# Patient Record
Sex: Female | Born: 1980 | Race: Black or African American | Hispanic: No | Marital: Married | Smoking: Current every day smoker
Health system: Southern US, Community
[De-identification: ages and names within clinical notes are randomized; demographics above are authoritative.]

## PROBLEM LIST (undated history)

## (undated) ENCOUNTER — Inpatient Hospital Stay (HOSPITAL_COMMUNITY): Payer: Self-pay

## (undated) DIAGNOSIS — B009 Herpesviral infection, unspecified: Secondary | ICD-10-CM

## (undated) DIAGNOSIS — R87629 Unspecified abnormal cytological findings in specimens from vagina: Secondary | ICD-10-CM

## (undated) DIAGNOSIS — O039 Complete or unspecified spontaneous abortion without complication: Secondary | ICD-10-CM

## (undated) DIAGNOSIS — C8190 Hodgkin lymphoma, unspecified, unspecified site: Secondary | ICD-10-CM

## (undated) DIAGNOSIS — I10 Essential (primary) hypertension: Secondary | ICD-10-CM

## (undated) DIAGNOSIS — C8590 Non-Hodgkin lymphoma, unspecified, unspecified site: Secondary | ICD-10-CM

## (undated) HISTORY — PX: TONSILLECTOMY: SUR1361

## (undated) HISTORY — DX: Non-Hodgkin lymphoma, unspecified, unspecified site: C85.90

## (undated) HISTORY — PX: OTHER SURGICAL HISTORY: SHX169

## (undated) HISTORY — PX: TYMPANOSTOMY TUBE PLACEMENT: SHX32

## (undated) HISTORY — DX: Unspecified abnormal cytological findings in specimens from vagina: R87.629

## (undated) HISTORY — DX: Complete or unspecified spontaneous abortion without complication: O03.9

## (undated) HISTORY — PX: BIOPSY SALIVARY GLAND: PRO30

---
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
## 2001-03-20 DIAGNOSIS — C8590 Non-Hodgkin lymphoma, unspecified, unspecified site: Secondary | ICD-10-CM

## 2001-03-20 HISTORY — DX: Non-Hodgkin lymphoma, unspecified, unspecified site: C85.90

## 2001-03-27 ENCOUNTER — Encounter: Payer: Self-pay | Admitting: Unknown Physician Specialty

## 2001-03-27 ENCOUNTER — Ambulatory Visit (HOSPITAL_COMMUNITY): Admission: RE | Admit: 2001-03-27 | Discharge: 2001-03-27 | Payer: Self-pay | Admitting: Otolaryngology

## 2001-03-29 ENCOUNTER — Ambulatory Visit (HOSPITAL_COMMUNITY): Admission: RE | Admit: 2001-03-29 | Discharge: 2001-03-29 | Payer: Self-pay | Admitting: Unknown Physician Specialty

## 2001-03-29 ENCOUNTER — Encounter: Payer: Self-pay | Admitting: Unknown Physician Specialty

## 2001-04-01 ENCOUNTER — Other Ambulatory Visit: Admission: RE | Admit: 2001-04-01 | Discharge: 2001-04-01 | Payer: Self-pay | Admitting: Otolaryngology

## 2001-04-02 ENCOUNTER — Other Ambulatory Visit: Admission: RE | Admit: 2001-04-02 | Discharge: 2001-04-02 | Payer: Self-pay | Admitting: Otolaryngology

## 2001-04-11 ENCOUNTER — Ambulatory Visit (HOSPITAL_COMMUNITY): Admission: RE | Admit: 2001-04-11 | Discharge: 2001-04-11 | Payer: Self-pay | Admitting: Otolaryngology

## 2001-04-11 ENCOUNTER — Encounter: Payer: Self-pay | Admitting: Otolaryngology

## 2001-04-26 ENCOUNTER — Encounter (HOSPITAL_COMMUNITY): Admission: RE | Admit: 2001-04-26 | Discharge: 2001-05-26 | Payer: Self-pay | Admitting: Oncology

## 2001-05-09 ENCOUNTER — Encounter (HOSPITAL_COMMUNITY): Payer: Self-pay | Admitting: Oncology

## 2001-05-10 ENCOUNTER — Encounter (HOSPITAL_COMMUNITY): Payer: Self-pay | Admitting: Oncology

## 2001-05-10 ENCOUNTER — Ambulatory Visit (HOSPITAL_COMMUNITY): Admission: RE | Admit: 2001-05-10 | Discharge: 2001-05-10 | Payer: Self-pay | Admitting: Oncology

## 2001-05-10 ENCOUNTER — Encounter: Payer: Self-pay | Admitting: General Surgery

## 2001-06-04 ENCOUNTER — Encounter (HOSPITAL_COMMUNITY): Admission: RE | Admit: 2001-06-04 | Discharge: 2001-07-04 | Payer: Self-pay | Admitting: Oncology

## 2001-06-04 ENCOUNTER — Encounter: Admission: RE | Admit: 2001-06-04 | Discharge: 2001-06-04 | Payer: Self-pay | Admitting: Oncology

## 2001-06-11 ENCOUNTER — Encounter (HOSPITAL_COMMUNITY): Payer: Self-pay | Admitting: Oncology

## 2001-06-18 ENCOUNTER — Emergency Department (HOSPITAL_COMMUNITY): Admission: EM | Admit: 2001-06-18 | Discharge: 2001-06-18 | Payer: Self-pay | Admitting: *Deleted

## 2001-06-18 ENCOUNTER — Encounter: Payer: Self-pay | Admitting: *Deleted

## 2001-06-18 ENCOUNTER — Encounter (HOSPITAL_COMMUNITY): Payer: Self-pay | Admitting: Oncology

## 2001-06-28 ENCOUNTER — Encounter (HOSPITAL_COMMUNITY): Payer: Self-pay | Admitting: Oncology

## 2001-07-02 ENCOUNTER — Encounter (HOSPITAL_COMMUNITY): Admission: RE | Admit: 2001-07-02 | Discharge: 2001-08-01 | Payer: Self-pay | Admitting: Oncology

## 2001-07-02 ENCOUNTER — Encounter: Admission: RE | Admit: 2001-07-02 | Discharge: 2001-07-02 | Payer: Self-pay | Admitting: Oncology

## 2001-07-09 ENCOUNTER — Encounter: Payer: Self-pay | Admitting: *Deleted

## 2001-07-09 ENCOUNTER — Emergency Department (HOSPITAL_COMMUNITY): Admission: EM | Admit: 2001-07-09 | Discharge: 2001-07-09 | Payer: Self-pay | Admitting: *Deleted

## 2001-08-06 ENCOUNTER — Encounter (HOSPITAL_COMMUNITY): Admission: RE | Admit: 2001-08-06 | Discharge: 2001-09-05 | Payer: Self-pay | Admitting: Oncology

## 2001-08-06 ENCOUNTER — Encounter: Admission: RE | Admit: 2001-08-06 | Discharge: 2001-08-06 | Payer: Self-pay | Admitting: Oncology

## 2001-08-26 ENCOUNTER — Ambulatory Visit (HOSPITAL_COMMUNITY): Admission: RE | Admit: 2001-08-26 | Discharge: 2001-08-26 | Payer: Self-pay | Admitting: Oncology

## 2001-08-26 ENCOUNTER — Encounter (HOSPITAL_COMMUNITY): Payer: Self-pay | Admitting: Oncology

## 2001-08-27 ENCOUNTER — Ambulatory Visit: Admission: RE | Admit: 2001-08-27 | Discharge: 2001-11-25 | Payer: Self-pay | Admitting: Radiation Oncology

## 2001-09-09 ENCOUNTER — Encounter (HOSPITAL_COMMUNITY): Admission: RE | Admit: 2001-09-09 | Discharge: 2001-10-09 | Payer: Self-pay | Admitting: Oncology

## 2001-09-09 ENCOUNTER — Encounter: Admission: RE | Admit: 2001-09-09 | Discharge: 2001-09-09 | Payer: Self-pay | Admitting: Oncology

## 2001-10-16 ENCOUNTER — Encounter: Admission: RE | Admit: 2001-10-16 | Discharge: 2001-10-16 | Payer: Self-pay | Admitting: Oncology

## 2001-10-16 ENCOUNTER — Encounter (HOSPITAL_COMMUNITY): Admission: RE | Admit: 2001-10-16 | Discharge: 2001-11-15 | Payer: Self-pay | Admitting: Oncology

## 2001-11-01 ENCOUNTER — Ambulatory Visit (HOSPITAL_COMMUNITY): Admission: RE | Admit: 2001-11-01 | Discharge: 2001-11-01 | Payer: Self-pay | Admitting: General Surgery

## 2001-11-21 ENCOUNTER — Encounter (HOSPITAL_COMMUNITY): Payer: Self-pay | Admitting: Oncology

## 2001-11-21 ENCOUNTER — Encounter (HOSPITAL_COMMUNITY): Admission: RE | Admit: 2001-11-21 | Discharge: 2001-12-21 | Payer: Self-pay | Admitting: Oncology

## 2002-02-17 ENCOUNTER — Emergency Department (HOSPITAL_COMMUNITY): Admission: EM | Admit: 2002-02-17 | Discharge: 2002-02-17 | Payer: Self-pay | Admitting: Emergency Medicine

## 2002-03-04 ENCOUNTER — Ambulatory Visit (HOSPITAL_COMMUNITY): Admission: RE | Admit: 2002-03-04 | Discharge: 2002-03-04 | Payer: Self-pay | Admitting: Internal Medicine

## 2002-03-04 ENCOUNTER — Encounter (HOSPITAL_COMMUNITY): Admission: RE | Admit: 2002-03-04 | Discharge: 2002-04-03 | Payer: Self-pay | Admitting: Oncology

## 2002-03-04 ENCOUNTER — Encounter: Payer: Self-pay | Admitting: Internal Medicine

## 2002-03-04 ENCOUNTER — Encounter: Admission: RE | Admit: 2002-03-04 | Discharge: 2002-03-04 | Payer: Self-pay | Admitting: Oncology

## 2002-05-21 ENCOUNTER — Encounter: Admission: RE | Admit: 2002-05-21 | Discharge: 2002-05-21 | Payer: Self-pay | Admitting: Oncology

## 2002-05-23 ENCOUNTER — Encounter (HOSPITAL_COMMUNITY): Payer: Self-pay | Admitting: Oncology

## 2002-05-23 ENCOUNTER — Encounter (HOSPITAL_COMMUNITY): Admission: RE | Admit: 2002-05-23 | Discharge: 2002-06-22 | Payer: Self-pay | Admitting: Oncology

## 2002-05-29 ENCOUNTER — Encounter (HOSPITAL_COMMUNITY): Payer: Self-pay | Admitting: Oncology

## 2002-06-14 ENCOUNTER — Emergency Department (HOSPITAL_COMMUNITY): Admission: EM | Admit: 2002-06-14 | Discharge: 2002-06-14 | Payer: Self-pay | Admitting: Internal Medicine

## 2002-06-29 ENCOUNTER — Emergency Department (HOSPITAL_COMMUNITY): Admission: EM | Admit: 2002-06-29 | Discharge: 2002-06-29 | Payer: Self-pay | Admitting: Emergency Medicine

## 2002-06-29 ENCOUNTER — Encounter: Payer: Self-pay | Admitting: Emergency Medicine

## 2002-09-02 ENCOUNTER — Encounter: Admission: RE | Admit: 2002-09-02 | Discharge: 2002-09-02 | Payer: Self-pay | Admitting: Oncology

## 2002-09-02 ENCOUNTER — Encounter (HOSPITAL_COMMUNITY): Admission: RE | Admit: 2002-09-02 | Discharge: 2002-10-02 | Payer: Self-pay | Admitting: Oncology

## 2002-11-28 ENCOUNTER — Encounter: Admission: RE | Admit: 2002-11-28 | Discharge: 2002-11-28 | Payer: Self-pay | Admitting: Oncology

## 2002-11-28 ENCOUNTER — Encounter (HOSPITAL_COMMUNITY): Admission: RE | Admit: 2002-11-28 | Discharge: 2002-12-18 | Payer: Self-pay | Admitting: Oncology

## 2002-12-04 ENCOUNTER — Encounter (HOSPITAL_COMMUNITY): Payer: Self-pay | Admitting: Oncology

## 2002-12-27 ENCOUNTER — Emergency Department (HOSPITAL_COMMUNITY): Admission: EM | Admit: 2002-12-27 | Discharge: 2002-12-27 | Payer: Self-pay | Admitting: Emergency Medicine

## 2003-03-28 ENCOUNTER — Emergency Department (HOSPITAL_COMMUNITY): Admission: EM | Admit: 2003-03-28 | Discharge: 2003-03-28 | Payer: Self-pay | Admitting: Emergency Medicine

## 2003-12-27 IMAGING — CT CT CHEST W/ CM
1 of 2 series · 14 of 32 positions shown, 18 images · IV contrast (omnipaque)
Comparison: none

FINDINGS
CLINICAL DATA: HODGKIN'S LYMPHOMA, STATUS POST CHEMOTHERAPY, SEVERE RIGHT SHOULDER AND BASE OF RIGHT NECK PAIN.
CT CHEST WITH CONTRAST
5 MM COLLIMATION HELICAL CT IMAGES OF THE CHEST PERFORMED AFTER 100 CC OF OMNIPAQUE 300.
COMPARISON 03/29/01.
SIGNIFICANT INTERVAL DECREASE IN SIZE OF MEDIASTINAL AND HILAR ADENOPATHY.  PERSISTENT ADENOPATHY
IS SEEN IN THE ANTERIOR MEDIASTINUM, AT THE SUPERIOR ASPECT OF THE AP WINDOW AND AT THE
AZYGOESOPHAGEAL RECESS.  NO PULMONARY INFILTRATE, EFFUSION, OR PNEUMOTHORAX.   NO FOCAL BONE
ABNORMALITY IS SEEN.  THORACIC VASCULAR STRUCTURES PATENT INCLUDING RIGHT SUBCLAVIAN VEIN WITH
SIGNIFICANT OPACIFICATION DUE TO INJECTION OF CONTRAST IN THE RIGHT UPPER EXTREMITY.  NO EVIDENCE
OF COLLATERALIZATION OR MASS.  SEVERAL SMALL SUPRACLAVICULAR LYMPH NODES ARE SEEN POSTERIORLY ON
THE RIGHT (IMAGE #2-5), LARGEST MEASURING 1.1 CM DIAMETER.  AIRWAY PATENT.  SCATTERED RESPIRATORY
MOTION ARTIFACTS ARE PRESENT AS THE PATIENT FAILED TO ADEQUATELY HOLD HER BREATH FOR EXAM.  LEFT
SIDE PORT-A-CATH PRESENT, TIP IN SVC.  NO THROMBUS IDENTIFIED.
VISUALIZED PORTION OF THE UPPER ABDOMEN UNREMARKABLE.
IMPRESSION
INTERVAL REDUCTION IN SIZE OF MEDIASTINAL AND HILAR ADENOPATHY.  NO FOCAL BONE ABNORMALITY SEEN.
SCATTERED RESPIRATORY MOTION ARTIFACTS.

[Series 1423: — · axial · 0.59mm/px · z∈[+1628,+1848]mm · 14 of 52 slices shown, 18 images]
[im 4/52  mediastinal]
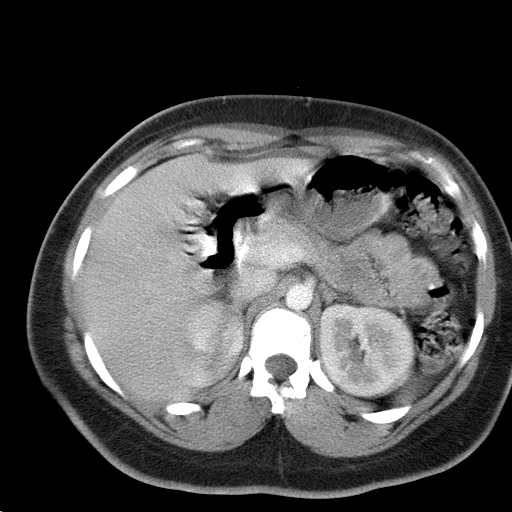
[im 4/52  lung]
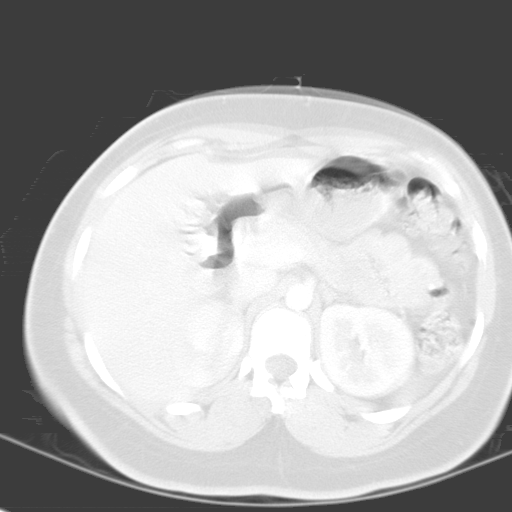
[im 8/52  lung]
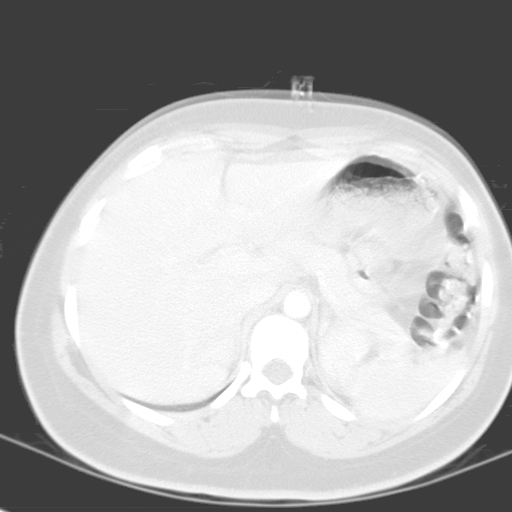
[im 12/52  lung]
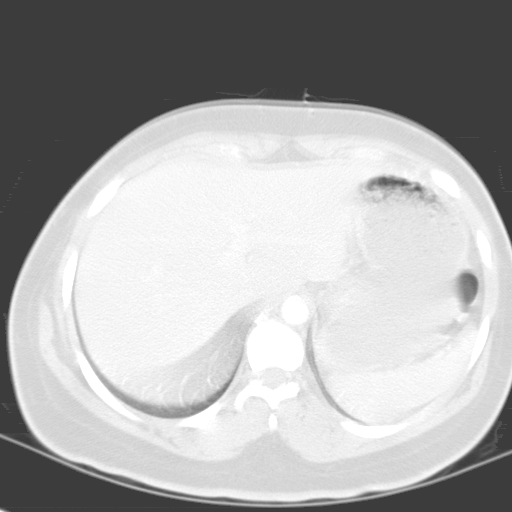
[im 16/52  lung]
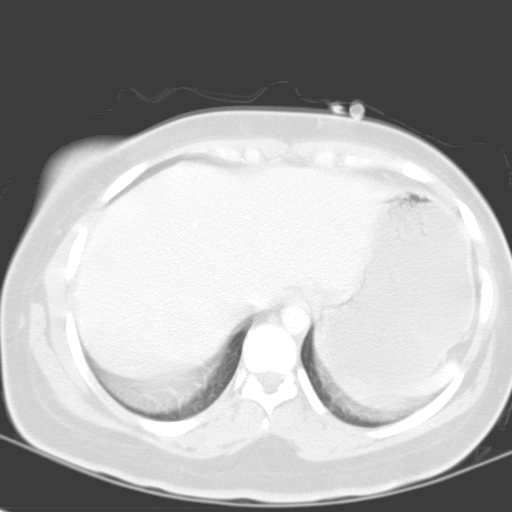
[im 20/52  mediastinal]
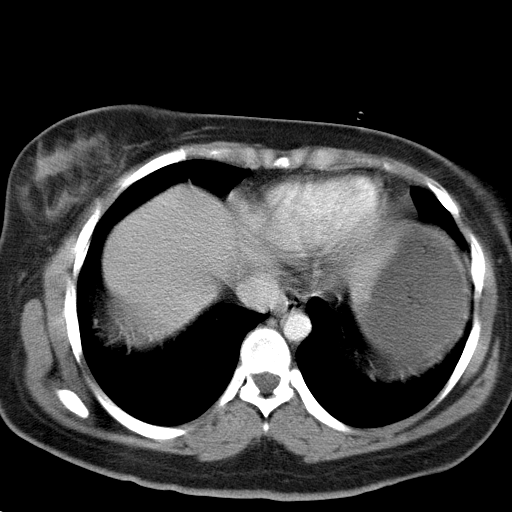
[im 20/52  lung]
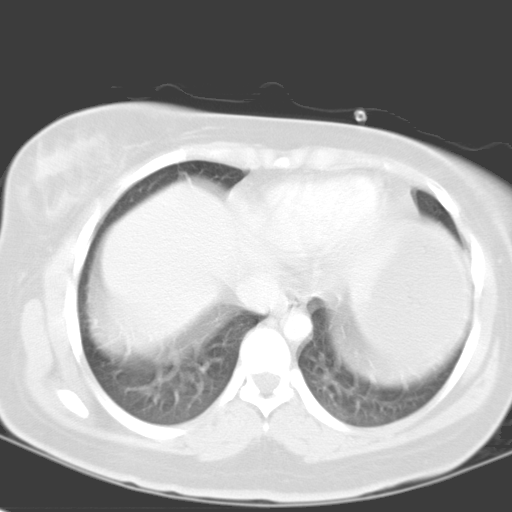
[im 24/52  lung]
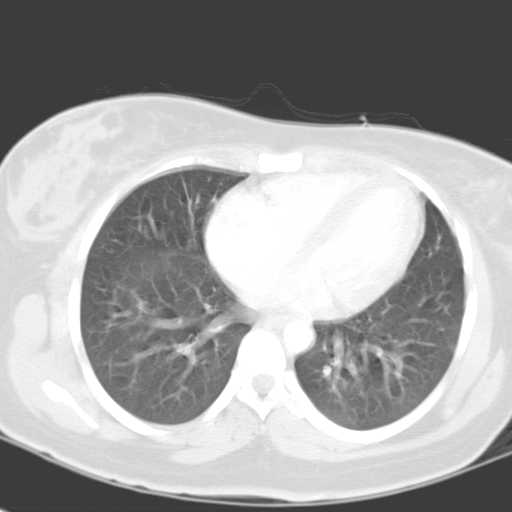
[im 25/52  lung]
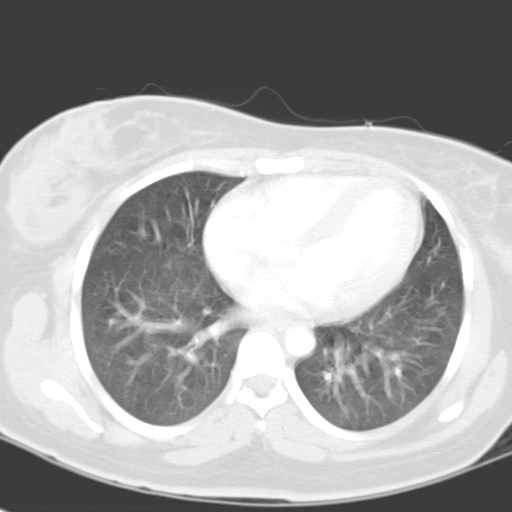
[im 26/52  lung]
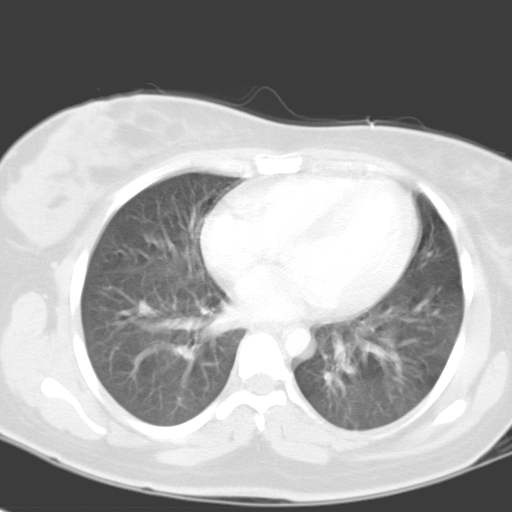
[im 28/52  mediastinal]
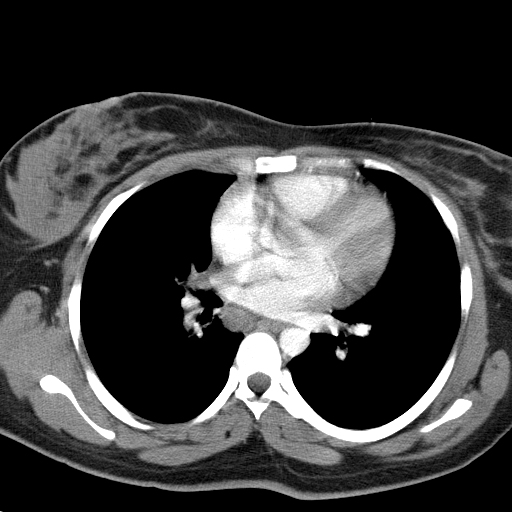
[im 28/52  lung]
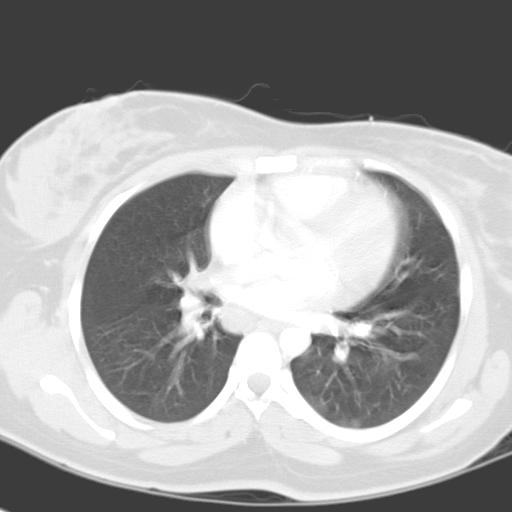
[im 32/52  lung]
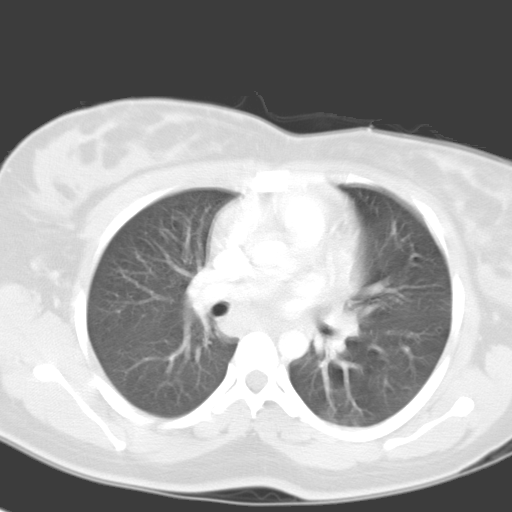
[im 36/52  lung]
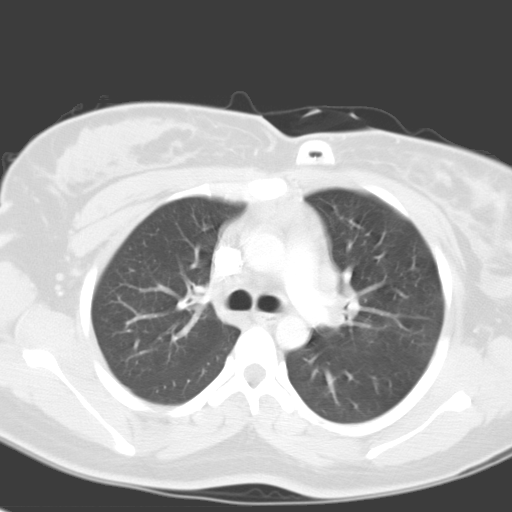
[im 40/52  lung]
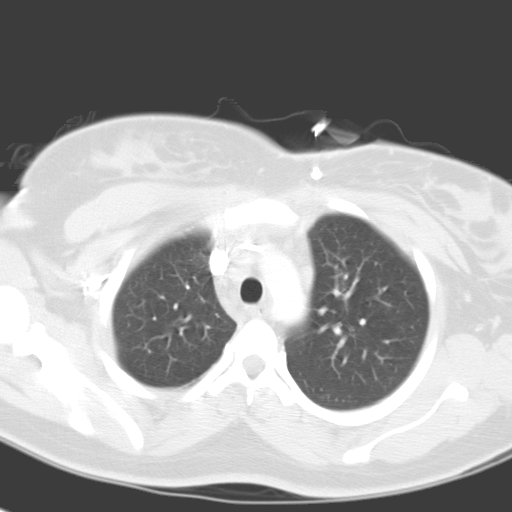
[im 44/52  mediastinal]
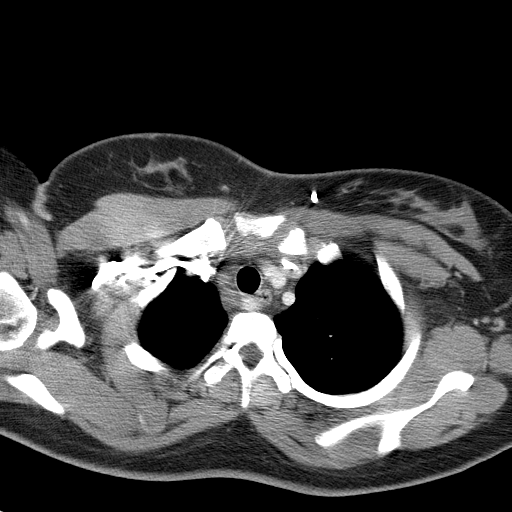
[im 44/52  lung]
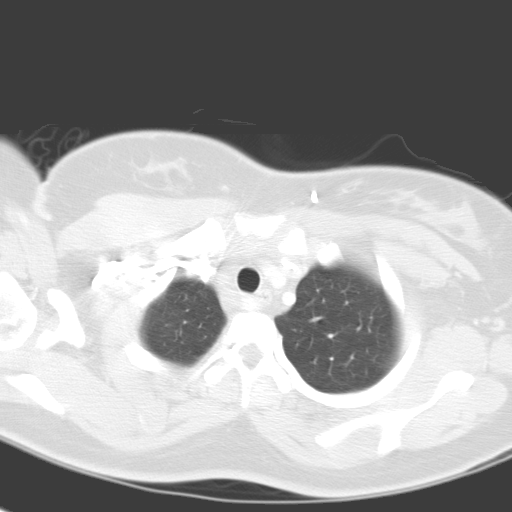
[im 48/52  lung]
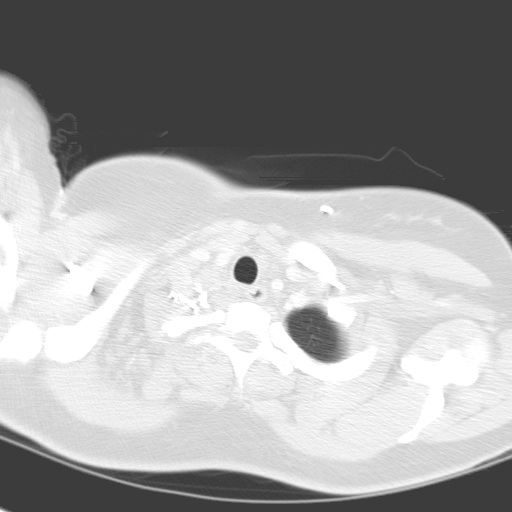

[14 of 32 positions shown; findings below may reference images not displayed]

## 2004-03-04 ENCOUNTER — Emergency Department (HOSPITAL_COMMUNITY): Admission: EM | Admit: 2004-03-04 | Discharge: 2004-03-04 | Payer: Self-pay | Admitting: Emergency Medicine

## 2004-03-09 ENCOUNTER — Encounter: Admission: RE | Admit: 2004-03-09 | Discharge: 2004-03-23 | Payer: Self-pay | Admitting: Oncology

## 2004-03-09 ENCOUNTER — Ambulatory Visit (HOSPITAL_COMMUNITY): Payer: Self-pay | Admitting: Oncology

## 2004-05-17 ENCOUNTER — Ambulatory Visit (HOSPITAL_COMMUNITY): Admission: AD | Admit: 2004-05-17 | Discharge: 2004-05-17 | Payer: Self-pay | Admitting: Obstetrics and Gynecology

## 2004-05-18 ENCOUNTER — Inpatient Hospital Stay (HOSPITAL_COMMUNITY): Admission: RE | Admit: 2004-05-18 | Discharge: 2004-05-21 | Payer: Self-pay | Admitting: Obstetrics and Gynecology

## 2004-05-26 ENCOUNTER — Ambulatory Visit (HOSPITAL_COMMUNITY): Payer: Self-pay | Admitting: Obstetrics & Gynecology

## 2004-05-26 ENCOUNTER — Encounter (HOSPITAL_COMMUNITY): Admission: RE | Admit: 2004-05-26 | Discharge: 2004-06-25 | Payer: Self-pay | Admitting: Obstetrics & Gynecology

## 2004-06-30 ENCOUNTER — Encounter (HOSPITAL_COMMUNITY): Admission: RE | Admit: 2004-06-30 | Discharge: 2004-07-30 | Payer: Self-pay | Admitting: Obstetrics & Gynecology

## 2004-08-11 ENCOUNTER — Inpatient Hospital Stay (HOSPITAL_COMMUNITY): Admission: AD | Admit: 2004-08-11 | Discharge: 2004-08-14 | Payer: Self-pay | Admitting: Obstetrics and Gynecology

## 2005-02-28 ENCOUNTER — Encounter (HOSPITAL_COMMUNITY): Admission: RE | Admit: 2005-02-28 | Discharge: 2005-03-01 | Payer: Self-pay | Admitting: Oncology

## 2005-02-28 ENCOUNTER — Ambulatory Visit (HOSPITAL_COMMUNITY): Payer: Self-pay | Admitting: Oncology

## 2005-02-28 ENCOUNTER — Encounter: Admission: RE | Admit: 2005-02-28 | Discharge: 2005-03-01 | Payer: Self-pay | Admitting: Oncology

## 2005-03-07 ENCOUNTER — Ambulatory Visit (HOSPITAL_COMMUNITY): Admission: RE | Admit: 2005-03-07 | Discharge: 2005-03-07 | Payer: Self-pay | Admitting: Oncology

## 2005-08-22 ENCOUNTER — Encounter: Admission: RE | Admit: 2005-08-22 | Discharge: 2005-08-22 | Payer: Self-pay | Admitting: Oncology

## 2007-04-17 ENCOUNTER — Emergency Department (HOSPITAL_COMMUNITY): Admission: EM | Admit: 2007-04-17 | Discharge: 2007-04-17 | Payer: Self-pay | Admitting: Emergency Medicine

## 2007-06-11 ENCOUNTER — Emergency Department (HOSPITAL_COMMUNITY): Admission: EM | Admit: 2007-06-11 | Discharge: 2007-06-11 | Payer: Self-pay | Admitting: Emergency Medicine

## 2007-09-04 ENCOUNTER — Other Ambulatory Visit: Admission: RE | Admit: 2007-09-04 | Discharge: 2007-09-04 | Payer: Self-pay | Admitting: Obstetrics and Gynecology

## 2007-09-09 IMAGING — US US MISC SOFT TISSUE
1 series · 8 of 8 positions shown · non-contrast
Comparison: none

CLINICAL DATA: 24 year old, left buttock mass.
 SOFT TISSUE ULTRASOUND:
TECHNIQUE: Ultrasound examination of the soft tissues was performed in the area of clinical concern.

[Series 1: unknown · 0.07mm/px · 8 of 8 slices shown]
[im 1/8]
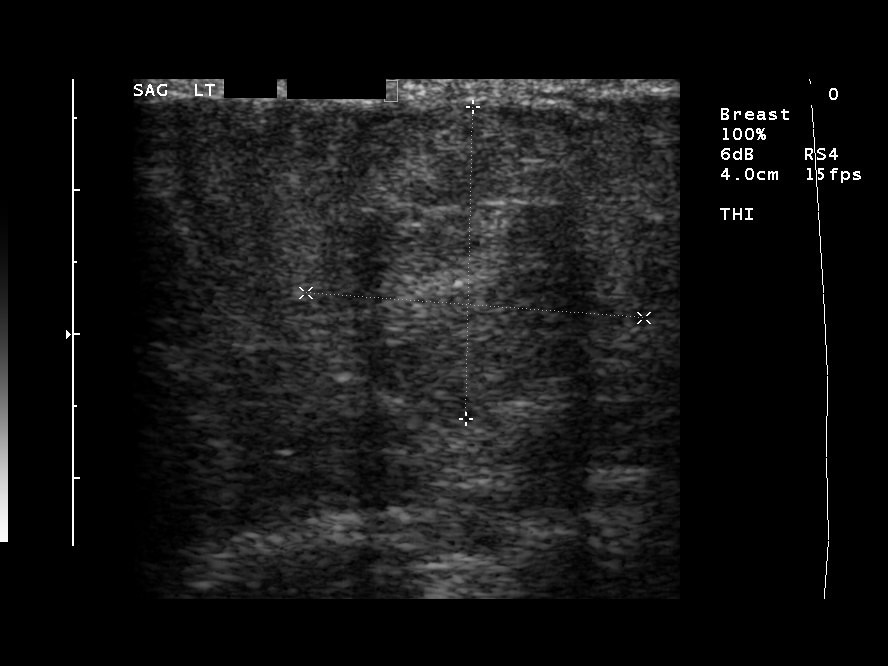
[im 2/8]
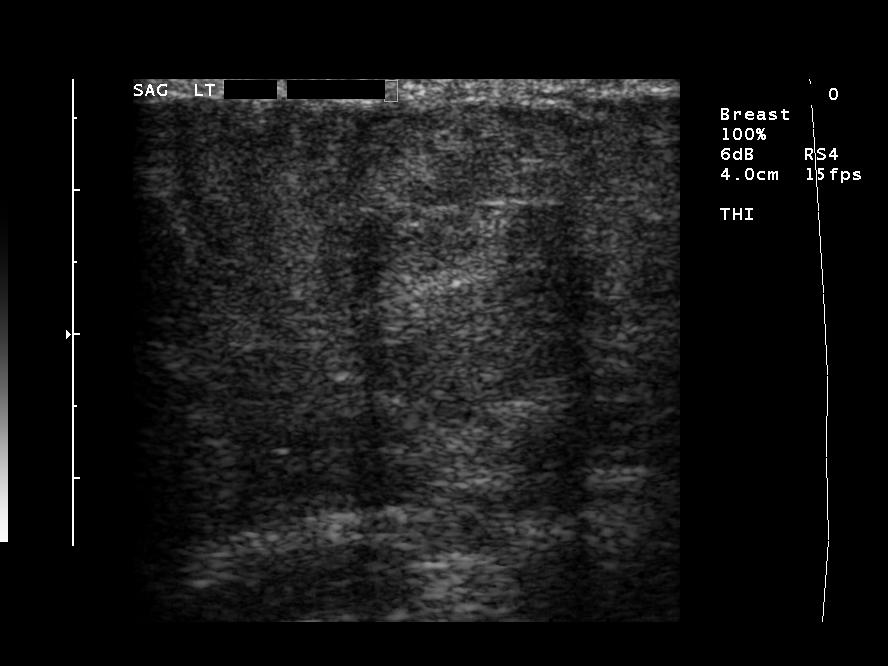
[im 3/8]
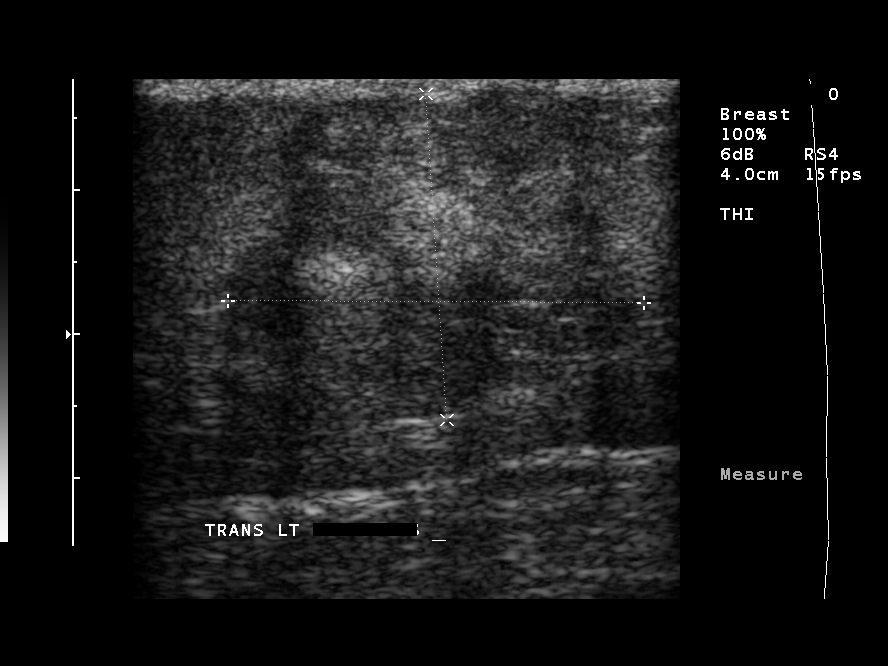
[im 4/8]
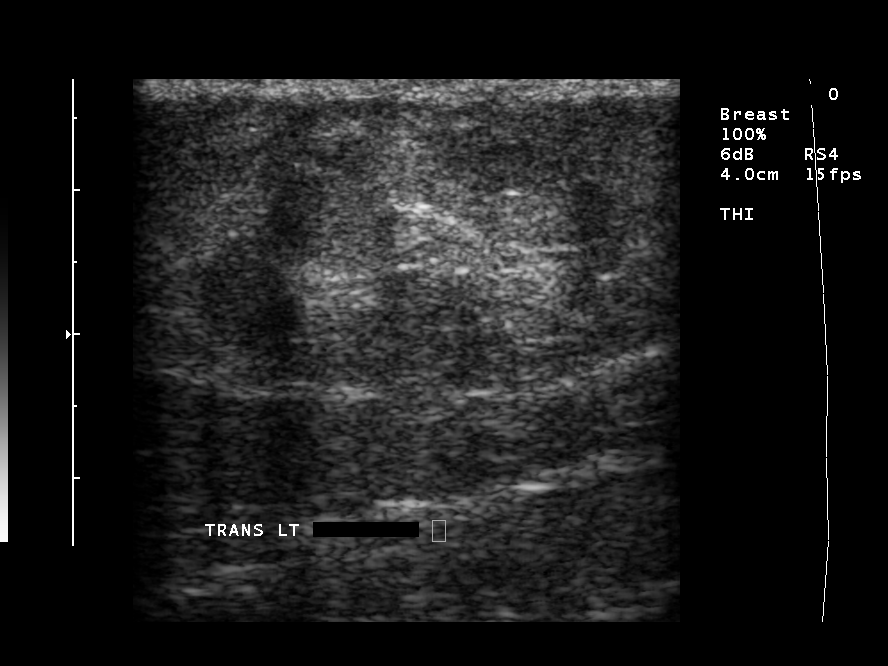
[im 5/8]
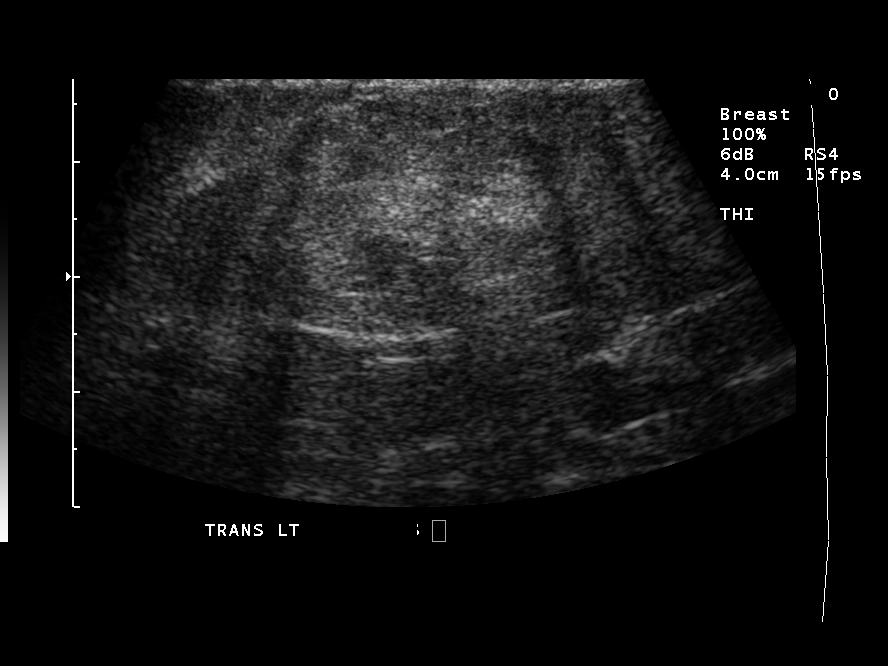
[im 6/8]
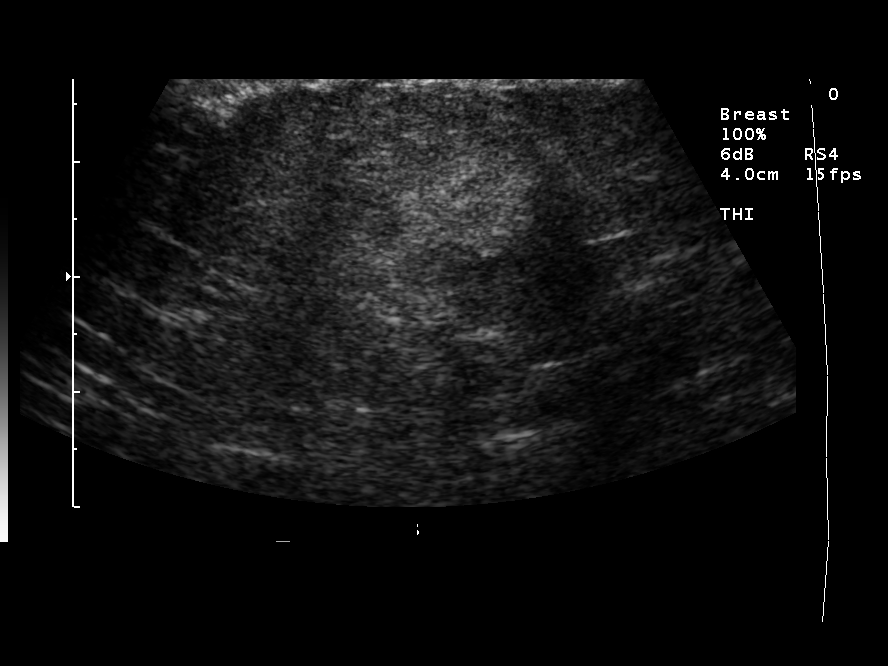
[im 7/8]
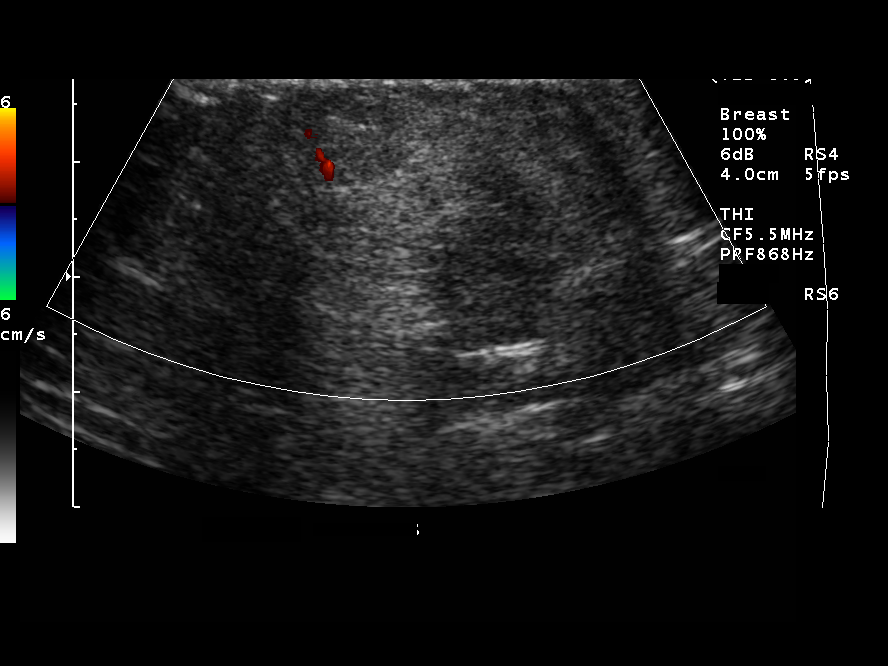
[im 8/8]
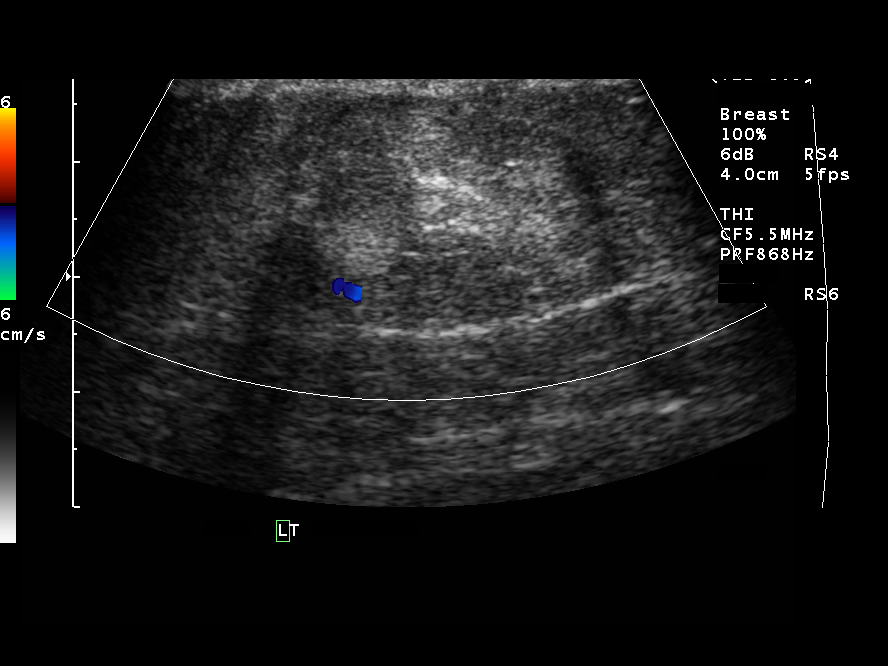

[8 of 8 positions shown; findings below may reference images not displayed]

FINDINGS: Ultrasound examination of the left buttock area demonstrates a slightly lobulated, slightly hyperechoic mass measuring approximately 2.9 x 2.3 cm.  I don?t see any cystic components.  It certainly could represent a subcutaneous lipoma but other soft tissue lesions are not excluded.  MR would be the best way to evaluate this lesion if clinically indicated.  Otherwise close clinical followup is suggested.
IMPRESSION: Solid appearing left buttock mass in subcutaneous fat.  It is slightly echogenic and could reflect a lipoma but other solid lesions are not excluded.  MR would be the best way to evaluate this if clinically indicated.  Otherwise close clinical followup suggested.

## 2008-07-13 ENCOUNTER — Emergency Department (HOSPITAL_COMMUNITY): Admission: EM | Admit: 2008-07-13 | Discharge: 2008-07-13 | Payer: Self-pay | Admitting: Emergency Medicine

## 2009-02-15 ENCOUNTER — Emergency Department (HOSPITAL_COMMUNITY): Admission: EM | Admit: 2009-02-15 | Discharge: 2009-02-15 | Payer: Self-pay | Admitting: Emergency Medicine

## 2009-08-23 ENCOUNTER — Emergency Department (HOSPITAL_COMMUNITY): Admission: EM | Admit: 2009-08-23 | Discharge: 2009-08-23 | Payer: Self-pay | Admitting: Emergency Medicine

## 2009-08-25 ENCOUNTER — Emergency Department (HOSPITAL_COMMUNITY): Admission: EM | Admit: 2009-08-25 | Discharge: 2009-08-25 | Payer: Self-pay | Admitting: Emergency Medicine

## 2010-04-10 ENCOUNTER — Encounter (HOSPITAL_COMMUNITY): Payer: Self-pay | Admitting: Oncology

## 2010-06-06 LAB — WET PREP, GENITAL
WBC, Wet Prep HPF POC: NONE SEEN
Yeast Wet Prep HPF POC: NONE SEEN

## 2010-06-06 LAB — ABO/RH: ABO/RH(D): O POS

## 2010-06-06 LAB — BASIC METABOLIC PANEL
BUN: 6 mg/dL (ref 6–23)
CO2: 26 mEq/L (ref 19–32)
Calcium: 9.4 mg/dL (ref 8.4–10.5)
Chloride: 106 mEq/L (ref 96–112)
Potassium: 3.7 mEq/L (ref 3.5–5.1)

## 2010-06-06 LAB — URINALYSIS, ROUTINE W REFLEX MICROSCOPIC
Ketones, ur: NEGATIVE mg/dL
Protein, ur: NEGATIVE mg/dL
Urobilinogen, UA: 0.2 mg/dL (ref 0.0–1.0)
pH: 6.5 (ref 5.0–8.0)

## 2010-06-06 LAB — URINE MICROSCOPIC-ADD ON

## 2010-06-06 LAB — DIFFERENTIAL
Eosinophils Relative: 1 % (ref 0–5)
Monocytes Absolute: 0.5 10*3/uL (ref 0.1–1.0)
Monocytes Relative: 6 % (ref 3–12)

## 2010-06-06 LAB — GC/CHLAMYDIA PROBE AMP, GENITAL
Chlamydia, DNA Probe: NEGATIVE
GC Probe Amp, Genital: NEGATIVE

## 2010-06-06 LAB — CBC
MCHC: 34.1 g/dL (ref 30.0–36.0)
RBC: 4.55 MIL/uL (ref 3.87–5.11)
RDW: 13.6 % (ref 11.5–15.5)
WBC: 7.9 10*3/uL (ref 4.0–10.5)

## 2010-06-06 LAB — RPR: RPR Ser Ql: NONREACTIVE

## 2010-06-22 LAB — URINALYSIS, ROUTINE W REFLEX MICROSCOPIC
Glucose, UA: NEGATIVE mg/dL
Ketones, ur: NEGATIVE mg/dL
Nitrite: NEGATIVE
pH: 6.5 (ref 5.0–8.0)

## 2010-06-22 LAB — URINE MICROSCOPIC-ADD ON

## 2010-06-22 LAB — DIFFERENTIAL
Basophils Absolute: 0 10*3/uL (ref 0.0–0.1)
Eosinophils Absolute: 0.1 10*3/uL (ref 0.0–0.7)
Eosinophils Relative: 3 % (ref 0–5)
Monocytes Relative: 7 % (ref 3–12)

## 2010-06-22 LAB — CBC
Platelets: 174 10*3/uL (ref 150–400)
RBC: 4.36 MIL/uL (ref 3.87–5.11)
WBC: 6.1 10*3/uL (ref 4.0–10.5)

## 2010-06-22 LAB — BASIC METABOLIC PANEL
BUN: 7 mg/dL (ref 6–23)
CO2: 27 mEq/L (ref 19–32)
Chloride: 104 mEq/L (ref 96–112)
GFR calc non Af Amer: >60 mL/min (ref >60)
Potassium: 3.4 mEq/L — ABNORMAL LOW (ref 3.5–5.1)
Sodium: 135 mEq/L (ref 135–145)

## 2010-08-05 NOTE — Op Note (Signed)
NAME:  [name], Sherry               ACCOUNT NO.:  [id number]   MEDICAL RECORD NO.:  [id number]          PATIENT TYPE:  INP   LOCATION:  LDR4                          FACILITY:  [facility]   PHYSICIAN:  Tilda Burrow, M.D. DATE OF BIRTH:  [birth year]   DATE OF PROCEDURE:  08/12/2004  DATE OF DISCHARGE:                                 OPERATIVE REPORT   DELIVERY TIME:  5:20 p.m. approximately on the 26th.   [name] progressed slowly through labor.  She was begun on Pitocin after  having had an excellent response to the Foley bulb.  She was 4 cm dilated,  80% effaced, -2 to -3 station the following morning.  Pitocin was begun at  3:30 a.m.  She progressed slowly through the day.  At midday, she was 5-6 cm  dilated and requested epidural.  This was placed, and she went quickly to,  within the hour, to completely dilated.  Vertex remained relatively high  still, and we used epidural slide before allowing her to begin to push  approximately 4 p.m.  She had some bradycardia but progressed slowly but  surely.  There was lots of molding present.  Shortly after 5 p.m., pushing  with the epidural having been discontinued was quite effective.  She brought  the baby from a persistent occiput transverse position, and it spontaneously  rotated occiput anterior.  The infant's right shoulder was moderately tight  beneath the symphysis pubis and initially could not be released.  The legs  were in the McRobert's position throughout the pushing phase.  This was  maintained and using index finger and corkscrew maneuver, we were able to  rotate the shoulders to transverse, whereupon the right shoulder slipped  easily beneath the vertex, and then we delivered the baby over an intact  perineum, a healthy female infant, Apgars 9 and 9.  There was significant  molding of the vertex.  The amniotic fluid was clear without malodor.  Bulb  suctioning of the pharynx was performed shortly after delivery of the head.  There were no nuchal cord loops noted.  The infant was delivered, placed on  maternal abdomen after bulb suctioning completed, and then cord cut and cord  blood samples then obtained.  Placenta delivered intact, Schultze's  presentation.  Uterine tone was excellent.  Blood loss 350 mL.  The perineum  was inspected, first-degree only noted.  The patient then had the epidural  catheter removed with tip visualized intact.  The family showed excellent  interest in the baby with good support from the baby's father and one other  family member in room.      JVF/MEDQ  D:  08/12/2004  T:  08/12/2004  Job:  [id number]   cc:   Francoise Schaumann. Halford Chessman  Fax: [phone]

## 2010-08-05 NOTE — Discharge Summary (Signed)
NAME:  [name], Sherry               ACCOUNT NO.:  [id number]   MEDICAL RECORD NO.:  [id number]          PATIENT TYPE:  INP   LOCATION:  LDR3                          FACILITY:  [facility]   PHYSICIAN:  Lazaro Arms, M.D.   DATE OF BIRTH:  1980-05-31   DATE OF ADMISSION:  05/18/2004  DATE OF DISCHARGE:  03/04/2006LH                                 DISCHARGE SUMMARY   DISCHARGE DIAGNOSES:  1.  Intrauterine pregnancy at [redacted] weeks gestation.  2.  Preterm labor.  3.  History of preterm labor.   PROCEDURE:  Admission to hospital with daily management, tocolysis of labor,  and discharge management.   Sherry is a 30 year old African American female who is admitted at 26+ weeks  gestation with uterine activity and minimal cervical change. She was placed  on magnesium sulfate tocolysis and given 17 alpha-hydroxyprogesterone  injection and Indomethacin, all of which seemed to combine to stop her  labor. She does have a significant history of preterm labor and delivery  with her last pregnancy. She was actually in the hospital several weeks up  at [facility]. Her cervix really had not undergone any change. She was  discharged to home back to be seen back in the office later in the week and  she will get weekly 17 alpha-hydroxyprogesterone injections 250 mg IM weekly  instead of p.o. terbutaline. The patient understands she needs to be on  bedrest, she is taken out of work. We will see her back next week.      LHE/MEDQ  D:  06/27/2004  T:  06/27/2004  Job:  [id number]

## 2010-08-05 NOTE — Op Note (Signed)
NAME:  BRIDGES, [place]               ACCOUNT NO.:  [id number]   MEDICAL RECORD NO.:  [id number]          PATIENT TYPE:  INP   LOCATION:  LDR4                          FACILITY:  [facility]   PHYSICIAN:  Tilda Burrow, M.D. DATE OF BIRTH:  03-22-1980   DATE OF PROCEDURE:  08/12/2004  DATE OF DISCHARGE:                                 OPERATIVE REPORT   PROCEDURE:  Epidural catheter placement. Loss-of-resistance technique used  with patient in sitting position, flexed forward, with patient's very puffy  back with loose mobile skin, making the procedure a little bit more  technically challenging. Fortunately, we were able to identify the epidural  space at 8 cm beneath the skin on the first attempt with loss-of-resistance  technique being used. Five cc test dose of 1.5% lidocaine with epinephrine  is instilled followed by placement of the epidural catheter 4 cm into the  epidural space, removal of the Tuohy needle and taping of the catheter to  the patient's back with bolus dose of 10 cc following, using 0.125% Marcaine  solution. The patient had good analgesic effect, became more somnolent due  to prior medications, with some decrease in blood pressure which responded  nicely to ephedrine. The patient had excellent analgesic effect and  subsequent exam by Jacklyn Shell, C.N.M., identified the patient  had moved quickly to reaching completely dilation. Prognosis for vaginal  delivery is considered excellent.      JVF/MEDQ  D:  08/12/2004  T:  08/12/2004  Job:  [id number]

## 2010-08-05 NOTE — H&P (Signed)
NAME:  Sherry [name], Sherry [name]               ACCOUNT NO.:  [id number]   MEDICAL RECORD NO.:  [id number]          PATIENT TYPE:  INP   LOCATION:  LDR4                          FACILITY:  [facility]   PHYSICIAN:  Tilda Burrow, M.D. DATE OF BIRTH:  December 18, 1980   DATE OF ADMISSION:  08/11/2004  DATE OF DISCHARGE:  LH                                HISTORY & PHYSICAL   CHIEF COMPLAINT:  Elective induction at 39-weeks gestation secondary to  significant and unremitting pelvic pressure.   HISTORY OF PRESENT ILLNESS:  This 30 year old female gravida 1 para 0  followed through her pregnancy through our office with a course complicated  by initial concerns of a possible cleft lip and palate with reassessment  done appearing grossly normal.  She had late pregnancy extreme pelvic  pressure and cervix seems to be 2 cm, 50%, -2 to -3 and quite anterior.  She  has a narrow android pelvis so prognosis of vaginal delivery may be decided  by the bony support structures.  The cervix was quite favorable.  The  patient requests induction of labor due to the severity of her discomfort.  Risks of Cesarean section and delivery have been discussed with the patient.   PAST MEDICAL HISTORY:  Positive for lymphoma, currently in remission.  Managed by Dr. Mariel Sleet.   PAST SURGICAL HISTORY:  Tonsillectomy.  Adenoidectomy.  Grommets in her ears  as a child.  Biopsy of lymph nodes in the neck.  Bone marrow aspiration.  Porta-Cath placement and removal.   ALLERGIES:  None known.   HABITS:  Cigarettes:  2-3 cigarettes per day.  Alcohol:  None.  THC:  None  at the present.   PHYSICAL EXAMINATION:  GENERAL:  Shows a healthy-appearing, obviously  uncomfortable African-American female.  HEENT:  Pupils equal, round, and reactive.  Extraocular movements intact.  CARDIOVASCULAR:  Unremarkable.  ABDOMEN:  Fundal height 39 cm with baby very anterior and high in the  pelvis.  Presenting confirmed as vertex cervix 2 cm 50% -2 and -3  in the  anterior position of the cervix.   PERTINENT LABORATORY DATA:  Blood type 0 positive.  Urine drug screen  positive for THC initially.  Rubella immunity present.  Hemoglobin 12,  hematocrit 37.  Hepatitis, HIV, GC and chlamydia all negative.  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  _MSAFP_  1 in  740 risk of Down syndrome, glucose tolerance test normal at 120 mg percent.   PLAN:  Foley bulb cervical ripening Thursday with Pitocin induction on  Friday.  Uncertain prognosis for vaginal delivery.      JVF/MEDQ  D:  08/09/2004  T:  08/09/2004  Job:  [id number]   cc:   Francoise Schaumann. Halford Chessman  Fax: [phone]

## 2010-08-05 NOTE — H&P (Signed)
NAME:  BRIDGES, [place]               ACCOUNT NO.:  [id number]   MEDICAL RECORD NO.:  [id number]          PATIENT TYPE:  INP   LOCATION:  LDR3                          FACILITY:  [facility]   PHYSICIAN:  Tilda Burrow, M.D. DATE OF BIRTH:  December 19, 1980   DATE OF ADMISSION:  DATE OF DISCHARGE:  LH                                HISTORY & PHYSICAL   ADMISSION DIAGNOSES:  Pregnancy, [redacted] weeks gestation, preterm cervical  changes, preterm labor versus cervical incompetency. Funneling of internal  os noted on ultrasound.   HISTORY OF PRESENT ILLNESS:  This 30 year old female, gravida 1, para 0 due  August 20, 2004 by last menstrual period, with 9 week ultrasound corresponding  exactly and confirmed again at 20 weeks, has been seen in our office and is  seen again in our office May 18, 2004 for follow up of emergency room  visits.  She was seen at [facility] Delivery May 17, 2004, was described as having a dimple of cervical dilation at that time.  She was given two terbutaline subcutaneous shots with three tablets that she  does not know the type given as well.  This morning she began to cramp again  with much rectal pressure noted.  Vaginal examination by Zerita Boers  shows cervix to be 1 to 2 cm, soft, mid position. Ultrasound is performed  which confirms a vertex presentation with a distinct funneling of cervix  noted upon mild uterine contraction.  The cervix measures approximately 3.5  cm in length with a 1.7 cm funneling which is greater than 1 cm in width.  This is a classic appearance of cervical incompetency.  Speculum examination  shows clear mucous without purulence.  GC and Chlamydia cultures have been  negative.   PAST MEDICAL HISTORY:  Positive for lymphoma treated by Dr. Glenford Peers,  in remission.   PAST SURGICAL HISTORY:  Tonsillectomy and adenoidectomy, grommets in both  ears as a child.  Cervical neck biopsy.  Bone marrow biopsy.  Port-A-Cath  placement and removal.   ALLERGIES:  None known.   SOCIAL HISTORY:  Single.  Works at [employer].  Supported by Milly Jakob,  partner.  She plans to circumcise the baby and breast feed with bottle  supplementation.   LABORATORY DATA:  Prenatal labs show blood type O positive. Urine drug  screen positive for marijuana, patient counseled. Rubella immune at present.  Hemoglobin 12, hematocrit 37.  Pap smear normal.  GC and Chlamydia negative.  MSAFP normal at 1:740.   PLAN:  Admit.  Mag sulfate.  Give some consideration to cerclage once the  mag sulfate is discontinued and patient remains stable.  Intravenous  antibiotics administered.  Betamethasone started.      JVF/MEDQ  D:  05/18/2004  T:  05/18/2004  Job:  [id number]

## 2010-11-20 ENCOUNTER — Encounter: Payer: Self-pay | Admitting: Emergency Medicine

## 2010-11-20 ENCOUNTER — Emergency Department (HOSPITAL_COMMUNITY)
Admission: EM | Admit: 2010-11-20 | Discharge: 2010-11-20 | Disposition: A | Discharge status: Home / Self Care | Payer: Self-pay | Attending: Emergency Medicine | Admitting: Emergency Medicine

## 2010-11-20 DIAGNOSIS — F17200 Nicotine dependence, unspecified, uncomplicated: Secondary | ICD-10-CM | POA: Insufficient documentation

## 2010-11-20 DIAGNOSIS — Z87898 Personal history of other specified conditions: Secondary | ICD-10-CM | POA: Insufficient documentation

## 2010-11-20 DIAGNOSIS — H8110 Benign paroxysmal vertigo, unspecified ear: Secondary | ICD-10-CM | POA: Insufficient documentation

## 2010-11-20 HISTORY — DX: Hodgkin lymphoma, unspecified, unspecified site: C81.90

## 2010-11-20 MED ORDER — PROMETHAZINE HCL 25 MG PO TABS: 25.0000 mg | ORAL_TABLET | Freq: Four times a day (QID) | ORAL | Status: DC | PRN

## 2010-11-20 MED ORDER — MECLIZINE HCL 12.5 MG PO TABS: 12.5000 mg | ORAL_TABLET | Freq: Three times a day (TID) | ORAL | Status: AC | PRN

## 2010-11-20 MED ORDER — MECLIZINE HCL 12.5 MG PO TABS
ORAL_TABLET | ORAL | Status: AC
  Filled 2010-11-20: qty 1

## 2010-11-20 MED ORDER — LORATADINE 10 MG PO TABS
10.0000 mg | ORAL_TABLET | Freq: Once | ORAL | Status: AC
  Administered 2010-11-20: 10 mg via ORAL
  Filled 2010-11-20: qty 1

## 2010-11-20 MED ORDER — ONDANSETRON HCL 4 MG PO TABS
4.0000 mg | ORAL_TABLET | Freq: Once | ORAL | Status: AC
  Administered 2010-11-20: 4 mg via ORAL
  Filled 2010-11-20: qty 1

## 2010-11-20 MED ORDER — MECLIZINE HCL 12.5 MG PO TABS
25.0000 mg | ORAL_TABLET | Freq: Once | ORAL | Status: AC
  Administered 2010-11-20: 25 mg via ORAL
  Filled 2010-11-20: qty 1

## 2010-11-20 NOTE — ED Provider Notes (Signed)
History     CSN: [id number] Arrival date & time: 11/20/2010 10:08 PM  Chief Complaint  Patient presents with  . Dizziness   HPI Comments: Seen 2311.  Patient is a 30 y.o. female presenting with neurologic complaint.  Neurologic Problem The primary symptoms include dizziness. Primary symptoms comment: vertigo x 2 weeks Episode onset: 2 weeks. The symptoms are unchanged.  Dizziness does not occur with tinnitus or weakness.  Additional symptoms include vertigo. Additional symptoms do not include neck stiffness, weakness, aura, hallucinations, hearing loss or tinnitus.    Past Medical History  Diagnosis Date  . Hodgkin's lymphoma     Past Surgical History  Procedure Date  . Tonsillectomy   . Biopsy salivary gland     No family history on file.  History  Substance Use Topics  . Smoking status: Current Everyday Smoker -- 0.5 packs/day    Types: Cigarettes  . Smokeless tobacco: Not on file  . Alcohol Use: Yes    OB History    Grav Para Term Preterm Abortions TAB SAB Ect Mult Living                  Review of Systems  HENT: Negative for hearing loss, neck stiffness and tinnitus.   Neurological: Positive for dizziness and vertigo. Negative for weakness.  Psychiatric/Behavioral: Negative for hallucinations.  All other systems reviewed and are negative.    Physical Exam  BP 134/93  Pulse 78  Temp(Src) 98.5 F (36.9 C) (Oral)  Resp 20  Ht 5\' 3"  (1.6 m)  Wt 173 lb (78.472 kg)  BMI 30.65 kg/m2  SpO2 100%  LMP 11/15/2010  Physical Exam  Nursing note and vitals reviewed. Constitutional: She is oriented to person, place, and time. She appears well-developed and well-nourished.  HENT:  Head: Normocephalic and atraumatic.  Right Ear: External ear normal.  Left Ear: External ear normal.  Mouth/Throat: Oropharynx is clear and moist.  Eyes: EOM are normal. Pupils are equal, round, and reactive to light.  Neck: Neck supple. No JVD present. No thyromegaly present.    Cardiovascular: Normal rate, normal heart sounds and intact distal pulses.   Pulmonary/Chest: Effort normal and breath sounds normal.  Abdominal: Soft. Bowel sounds are normal.  Musculoskeletal: Normal range of motion.  Lymphadenopathy:    She has no cervical adenopathy.  Neurological: She is alert and oriented to person, place, and time. She displays normal reflexes. No cranial nerve deficit. Coordination normal.  Skin: Skin is warm and dry.    ED Course  Procedures  Patient with vertigo without any recollection of any recent URI symptoms. Associated with nausea, no vomiting. MDM Reviewed: nursing note and vitals       Nicoletta Dress. Colon Branch, MD 11/20/10 2320

## 2010-11-20 NOTE — ED Notes (Signed)
Patient states that she has been having dizzy spells x 2 weeks.  States has been having syncopal episodes x 2 days.

## 2011-02-01 LAB — OB RESULTS CONSOLE HIV ANTIBODY (ROUTINE TESTING): HIV: NONREACTIVE

## 2011-02-01 LAB — OB RESULTS CONSOLE RPR: RPR: NONREACTIVE

## 2011-02-01 LAB — OB RESULTS CONSOLE GC/CHLAMYDIA: Gonorrhea: NEGATIVE

## 2011-02-01 LAB — OB RESULTS CONSOLE ABO/RH: RH Type: POSITIVE

## 2011-03-07 ENCOUNTER — Other Ambulatory Visit: Payer: Self-pay | Admitting: Obstetrics & Gynecology

## 2011-03-07 ENCOUNTER — Other Ambulatory Visit (HOSPITAL_COMMUNITY)
Admission: RE | Admit: 2011-03-07 | Discharge: 2011-03-07 | Disposition: A | Discharge status: Home / Self Care | Payer: Medicaid Other | Source: Ambulatory Visit | Attending: Obstetrics & Gynecology | Admitting: Obstetrics & Gynecology

## 2011-03-07 DIAGNOSIS — Z01419 Encounter for gynecological examination (general) (routine) without abnormal findings: Secondary | ICD-10-CM | POA: Insufficient documentation

## 2011-03-07 DIAGNOSIS — Z113 Encounter for screening for infections with a predominantly sexual mode of transmission: Secondary | ICD-10-CM | POA: Insufficient documentation

## 2011-03-07 DIAGNOSIS — R87810 Cervical high risk human papillomavirus (HPV) DNA test positive: Secondary | ICD-10-CM | POA: Insufficient documentation

## 2011-03-21 NOTE — L&D Delivery Note (Signed)
Operative Delivery Note At 12:53 AM a viable female was delivered via Vaginal, Spontaneous Delivery.  Presentation: vertex; Position: Left,, Occiput,, Anterior; Station: +3.  Verbal consent: obtained from patient.  Risks and benefits discussed in detail.  Risks include, but are not limited to the risks of anesthesia, bleeding, infection, damage to maternal tissues, fetal cephalhematoma.  There is also the risk of inability to effect vaginal delivery of the head, or shoulder dystocia that cannot be resolved by established maneuvers, leading to the need for emergency cesarean section.  APGAR: , ; weight .   Placenta status: , Spontaneous.   Cord:  with the following complications: .  Cord pH: n/a  Anesthesia:  epidural Instruments: Kiwi vaccuum with 1 pull (in the green zone) Episiotomy: none   Lacerations: none Suture Repair: n/a Est. Blood Loss (mL):   Mom to postpartum.  Baby to nursery-stable.  Sherry [name] C. 09/19/2011, 1:04 AM

## 2011-08-22 LAB — OB RESULTS CONSOLE GBS: GBS: NEGATIVE

## 2011-08-27 ENCOUNTER — Inpatient Hospital Stay (HOSPITAL_COMMUNITY)
Admission: AD | Admit: 2011-08-27 | Discharge: 2011-08-27 | Disposition: A | Discharge status: Home / Self Care | Payer: Medicaid Other | Source: Ambulatory Visit | Attending: Obstetrics & Gynecology | Admitting: Obstetrics & Gynecology

## 2011-08-27 ENCOUNTER — Encounter (HOSPITAL_COMMUNITY): Payer: Self-pay

## 2011-08-27 DIAGNOSIS — O368390 Maternal care for abnormalities of the fetal heart rate or rhythm, unspecified trimester, not applicable or unspecified: Secondary | ICD-10-CM | POA: Insufficient documentation

## 2011-08-27 DIAGNOSIS — O479 False labor, unspecified: Secondary | ICD-10-CM

## 2011-08-27 DIAGNOSIS — O4700 False labor before 37 completed weeks of gestation, unspecified trimester: Secondary | ICD-10-CM | POA: Insufficient documentation

## 2011-08-27 HISTORY — DX: Herpesviral infection, unspecified: B00.9

## 2011-08-27 MED ORDER — NALBUPHINE HCL 10 MG/ML IJ SOLN
10.0000 mg | INTRAMUSCULAR | Status: DC | PRN
  Administered 2011-08-27: 10 mg via INTRAMUSCULAR
  Filled 2011-08-27: qty 1

## 2011-08-27 MED ORDER — PROMETHAZINE HCL 25 MG PO TABS
25.0000 mg | ORAL_TABLET | Freq: Four times a day (QID) | ORAL | Status: DC | PRN
  Administered 2011-08-27: 25 mg via ORAL
  Filled 2011-08-27: qty 1

## 2011-08-27 MED ORDER — NALBUPHINE HCL 10 MG/ML IJ SOLN: 10.0000 mg | INTRAMUSCULAR | Status: DC | PRN

## 2011-08-27 MED ORDER — PROMETHAZINE HCL 25 MG PO TABS: 12.5000 mg | ORAL_TABLET | Freq: Four times a day (QID) | ORAL | Status: DC | PRN

## 2011-08-27 NOTE — MAU Note (Addendum)
Patient is in for labor eval. States that she has frequent and painful ctx since midnight. She reports good fetal movement and denies any vaginal bleeding or lof. Patient states that she was 2cm last week.

## 2011-08-27 NOTE — MAU Provider Note (Signed)
  History     CSN: [id number]  Arrival date and time: 08/27/11 7829   First Provider Initiated Contact with Patient 08/27/11 0540      Chief Complaint  Patient presents with  . Labor Eval   HPI  31 year old FT patient G3P1 presents with contractions for labor evaluation.  She has been having contractions since 11:45 p.m. Yesterday night.  No vaginal bleeding.  Baby has been moving normally and has moved in the last hour.  No loss of fluid.  She lives 45 minutes away.  Has nausea/vomiting.  No chest pain/dyspnea.  OB History    Grav Para Term Preterm Abortions TAB SAB Ect Mult Living   3 1 1  1  1   1       Past Medical History  Diagnosis Date  . Hodgkin's lymphoma   . HSV-2 infection     Past Surgical History  Procedure Date  . Tonsillectomy   . Biopsy salivary gland     History reviewed. No pertinent family history.  History  Substance Use Topics  . Smoking status: Current Everyday Smoker -- 0.5 packs/day    Types: Cigarettes  . Smokeless tobacco: Not on file  . Alcohol Use: Yes    Allergies:  Allergies  Allergen Reactions  . Cefprozil Hives  . Zithromax (Azithromycin) Nausea And Vomiting    Prescriptions prior to admission  Medication Sig Dispense Refill  . calcium carbonate (TUMS - DOSED IN MG ELEMENTAL CALCIUM) 500 MG chewable tablet Chew 1 tablet by mouth as needed.      Marland Kitchen ibuprofen (ADVIL,MOTRIN) 200 MG tablet Take 400 mg by mouth every 6 (six) hours as needed. Pain          ROS  Pertinent ROS negative except as per HPI  Physical Exam   Blood pressure 118/76, pulse 107, temperature 98.9 F (37.2 C), temperature source Oral, resp. rate 18, height 5\' 3"  (1.6 m), weight 95.369 kg (210 lb 4 oz), last menstrual period 11/15/2010.  Physical Exam  Constitutional: She is oriented to person, place, and time. She appears well-developed and well-nourished. No distress.  HENT:  Head: Normocephalic and atraumatic.  Eyes: Conjunctivae and EOM are  normal. Right eye exhibits no discharge. Left eye exhibits no discharge. No scleral icterus.  Neck: No tracheal deviation present.  Cardiovascular: Normal rate, regular rhythm and normal heart sounds.   Respiratory: Effort normal and breath sounds normal. No stridor.  GI: Soft. She exhibits no distension. There is no tenderness. There is no rebound and no guarding.       Gravid  Musculoskeletal: She exhibits no edema.  Neurological: She is alert and oriented to person, place, and time.  Skin: Skin is warm and dry. She is not diaphoretic.  Psychiatric: She has a normal mood and affect. Her behavior is normal. Thought content normal.   Cervical exams by RN, 4.0 -> 4.0 -> 4.5  MAU Course  Procedures  Fetal heart tones: baseline 145, moderate variability, variable decels.  Assessment and Plan  Patient was offered the option of staying by Philipp Deputy, CNM, and elected to leave with a plan to return later in labor.  [name] 08/27/2011, 5:40 AM   I have seen and examined this patient and I agree with the above. Tarek Cravens 9:05 AM 08/27/2011

## 2011-08-27 NOTE — Progress Notes (Signed)
Kim shaw cnm notified of patient and her complaints and request to speak with another provider, update on tracing, ctx pattern and sve result. She will come to [place] and speak with patient

## 2011-08-27 NOTE — Discharge Instructions (Signed)

## 2011-08-27 NOTE — Progress Notes (Signed)
Dr Vivien Rossetti notified of current sve result, patient c/o rectal pressure and requesting pain medicine. She states that she will enter order for pain medicine and recheck cervix in an hour.

## 2011-08-27 NOTE — MAU Note (Signed)
Pt desires to go home at this time. Labor precautions discussed with patient and her significant other. Pt voices concern about distance to hospital from home, however feels reassured after discussion with RN.

## 2011-08-28 NOTE — MAU Provider Note (Signed)
Attestation of Attending Supervision of Advanced Practitioner: Evaluation and management procedures were performed by the OB Fellow/PA/CNM/NP under my supervision and collaboration. Chart reviewed, and agree with management and plan.  Amna Welker, M.D. 08/28/2011 7:51 AM   

## 2011-09-18 ENCOUNTER — Inpatient Hospital Stay (HOSPITAL_COMMUNITY): Payer: Medicaid Other | Admitting: Anesthesiology

## 2011-09-18 ENCOUNTER — Encounter (HOSPITAL_COMMUNITY): Payer: Self-pay | Admitting: *Deleted

## 2011-09-18 ENCOUNTER — Encounter (HOSPITAL_COMMUNITY): Payer: Self-pay | Admitting: Anesthesiology

## 2011-09-18 ENCOUNTER — Inpatient Hospital Stay (HOSPITAL_COMMUNITY)
Admission: AD | Admit: 2011-09-18 | Discharge: 2011-09-20 | DRG: 775 | Disposition: A | Discharge status: Home / Self Care | Payer: Medicaid Other | Source: Ambulatory Visit | Attending: Obstetrics & Gynecology | Admitting: Obstetrics & Gynecology

## 2011-09-18 ENCOUNTER — Telehealth (HOSPITAL_COMMUNITY): Payer: Self-pay | Admitting: *Deleted

## 2011-09-18 LAB — CBC
HCT: 35.8 % — ABNORMAL LOW (ref 36.0–46.0)
Hemoglobin: 11.9 g/dL — ABNORMAL LOW (ref 12.0–15.0)
WBC: 9.6 10*3/uL (ref 4.0–10.5)

## 2011-09-18 MED ORDER — CITRIC ACID-SODIUM CITRATE 334-500 MG/5ML PO SOLN: 30.0000 mL | ORAL | Status: DC | PRN

## 2011-09-18 MED ORDER — LIDOCAINE HCL (PF) 1 % IJ SOLN
INTRAMUSCULAR | Status: DC | PRN
  Administered 2011-09-18 (×3): 4 mL

## 2011-09-18 MED ORDER — LIDOCAINE HCL (PF) 1 % IJ SOLN: 30.0000 mL | INTRAMUSCULAR | Status: DC | PRN

## 2011-09-18 MED ORDER — EPHEDRINE 5 MG/ML INJ
10.0000 mg | INTRAVENOUS | Status: DC | PRN
  Filled 2011-09-18: qty 4

## 2011-09-18 MED ORDER — LACTATED RINGERS IV SOLN: INTRAVENOUS | Status: DC

## 2011-09-18 MED ORDER — OXYTOCIN 40 UNITS IN LACTATED RINGERS INFUSION - SIMPLE MED
62.5000 mL/h | Freq: Once | INTRAVENOUS | Status: AC
  Administered 2011-09-19: 62.5 mL/h via INTRAVENOUS

## 2011-09-18 MED ORDER — FLEET ENEMA 7-19 GM/118ML RE ENEM: 1.0000 | ENEMA | RECTAL | Status: DC | PRN

## 2011-09-18 MED ORDER — BUTORPHANOL TARTRATE 2 MG/ML IJ SOLN
1.0000 mg | INTRAMUSCULAR | Status: DC | PRN
  Administered 2011-09-18: 1 mg via INTRAVENOUS
  Filled 2011-09-18: qty 1

## 2011-09-18 MED ORDER — PHENYLEPHRINE 40 MCG/ML (10ML) SYRINGE FOR IV PUSH (FOR BLOOD PRESSURE SUPPORT)
80.0000 ug | PREFILLED_SYRINGE | INTRAVENOUS | Status: DC | PRN
  Filled 2011-09-18: qty 5

## 2011-09-18 MED ORDER — DIPHENHYDRAMINE HCL 50 MG/ML IJ SOLN: 12.5000 mg | INTRAMUSCULAR | Status: DC | PRN

## 2011-09-18 MED ORDER — LACTATED RINGERS IV SOLN
500.0000 mL | Freq: Once | INTRAVENOUS | Status: AC
  Administered 2011-09-18: 500 mL via INTRAVENOUS

## 2011-09-18 MED ORDER — FENTANYL 2.5 MCG/ML BUPIVACAINE 1/10 % EPIDURAL INFUSION (WH - ANES)
14.0000 mL/h | INTRAMUSCULAR | Status: DC
  Administered 2011-09-18: 14 mL/h via EPIDURAL
  Filled 2011-09-18: qty 60

## 2011-09-18 MED ORDER — ONDANSETRON HCL 4 MG/2ML IJ SOLN: 4.0000 mg | Freq: Four times a day (QID) | INTRAMUSCULAR | Status: DC | PRN

## 2011-09-18 MED ORDER — EPHEDRINE 5 MG/ML INJ: 10.0000 mg | INTRAVENOUS | Status: DC | PRN

## 2011-09-18 MED ORDER — OXYCODONE-ACETAMINOPHEN 5-325 MG PO TABS: 1.0000 | ORAL_TABLET | ORAL | Status: DC | PRN

## 2011-09-18 MED ORDER — OXYTOCIN BOLUS FROM INFUSION
250.0000 mL | Freq: Once | INTRAVENOUS | Status: DC
  Filled 2011-09-18: qty 500

## 2011-09-18 MED ORDER — IBUPROFEN 600 MG PO TABS: 600.0000 mg | ORAL_TABLET | Freq: Four times a day (QID) | ORAL | Status: DC | PRN

## 2011-09-18 MED ORDER — ACETAMINOPHEN 325 MG PO TABS: 650.0000 mg | ORAL_TABLET | ORAL | Status: DC | PRN

## 2011-09-18 MED ORDER — LIDOCAINE HCL (PF) 1 % IJ SOLN
INTRAMUSCULAR | Status: AC
  Filled 2011-09-18: qty 30

## 2011-09-18 MED ORDER — LACTATED RINGERS IV SOLN: 500.0000 mL | INTRAVENOUS | Status: DC | PRN

## 2011-09-18 MED ORDER — OXYTOCIN 40 UNITS IN LACTATED RINGERS INFUSION - SIMPLE MED
INTRAVENOUS | Status: AC
  Filled 2011-09-18: qty 1000

## 2011-09-18 MED ORDER — PHENYLEPHRINE 40 MCG/ML (10ML) SYRINGE FOR IV PUSH (FOR BLOOD PRESSURE SUPPORT): 80.0000 ug | PREFILLED_SYRINGE | INTRAVENOUS | Status: DC | PRN

## 2011-09-18 NOTE — Anesthesia Preprocedure Evaluation (Signed)
Anesthesia Evaluation  Patient identified by MRN, date of birth, ID band Patient awake    Reviewed: Allergy & Precautions, H&P , NPO status , Patient's Chart, lab work & pertinent test results, reviewed documented beta blocker date and time   History of Anesthesia Complications Negative for: history of anesthetic complications  Airway Mallampati: II TM Distance: >3 FB Neck ROM: full    Dental  (+) Teeth Intact   Pulmonary Current Smoker,  breath sounds clear to auscultation        Cardiovascular negative cardio ROS  Rhythm:regular Rate:Normal     Neuro/Psych negative neurological ROS  negative psych ROS   GI/Hepatic negative GI ROS, Neg liver ROS, (+)     substance abuse  marijuana use,   Endo/Other  negative endocrine ROS  Renal/GU negative Renal ROS     Musculoskeletal   Abdominal   Peds  Hematology History of lymphoma   Anesthesia Other Findings   Reproductive/Obstetrics (+) Pregnancy                           Anesthesia Physical Anesthesia Plan  ASA: II  Anesthesia Plan: Epidural   Post-op Pain Management:    Induction:   Airway Management Planned:   Additional Equipment:   Intra-op Plan:   Post-operative Plan:   Informed Consent: I have reviewed the patients History and Physical, chart, labs and discussed the procedure including the risks, benefits and alternatives for the proposed anesthesia with the patient or authorized representative who has indicated his/her understanding and acceptance.     Plan Discussed with:   Anesthesia Plan Comments:         Anesthesia Quick Evaluation

## 2011-09-18 NOTE — Progress Notes (Signed)
Patient ID: Sherry [name], female   DOB: Jun 16, 1980, 31 y.o.   MRN: [id number] Sherry [name] is a 31 y.o. G3P1011 at [redacted]w[redacted]d admitted for active labor at term  Subjective: Very unomfortable.  Objective: LMP 11/15/2010  Fetal Heart FHR: 145 bpm, variability: moderate,  accelerations:  Present,  decelerations:  Absent   Contractions: q2-2 1/2 min  SVE:  Had SROM lg amt watery meconium-stained fluid during previous exam and had some regression of dilatation to 7 cm Now 7-8/100/-2 vtx   Results for orders placed during the hospital encounter of 09/18/11 (from the past 24 hour(s))  CBC     Status: Abnormal   Collection Time   09/18/11 11:00 PM      Component Value Range   WBC 9.6  4.0 - 10.5 K/uL   RBC 3.99  3.87 - 5.11 MIL/uL   Hemoglobin 11.9 (*) 12.0 - 15.0 g/dL   HCT 78.2 (*) 95.6 - 21.3 %   MCV 89.7  78.0 - 100.0 fL   MCH 29.8  26.0 - 34.0 pg   MCHC 33.2  30.0 - 36.0 g/dL   RDW 08.6  57.8 - 46.9 %   Platelets 199  150 - 400 K/uL   Assessment / Plan:  Labor: Active Fetal Wellbeing: Cat 1 Pain Control: inadequate awaiting epidural Expected mode of delivery: NSVD  Krystal Delduca 09/18/2011, 11:16 PM

## 2011-09-18 NOTE — Telephone Encounter (Signed)
Preadmission screen  

## 2011-09-18 NOTE — H&P (Signed)
Sherry [name] is a 31 y.o. female presenting for labor. Maternal Medical History:  Reason for admission: Reason for admission: contractions.  Contractions: Onset was 6-12 hours ago.   Frequency: regular.   Duration is approximately 60 seconds.   Perceived severity is strong.    Fetal activity: Perceived fetal activity is normal.   Last perceived fetal movement was within the past hour.    Prenatal complications: no prenatal complications Prenatal Complications - Diabetes: none.    OB History    Grav Para Term Preterm Abortions TAB SAB Ect Mult Living   3 1 1  1  1   1      Past Medical History  Diagnosis Date  . HSV-2 infection   . Hodgkin's lymphoma   . Non Hodgkin's lymphoma 2003   Past Surgical History  Procedure Date  . Tonsillectomy   . Biopsy salivary gland   . Port acath    Family History: family history includes Alcohol abuse in her maternal grandfather; Arthritis in her maternal grandmother; Cancer in her maternal grandmother; Diabetes in her maternal aunt, maternal grandmother, maternal uncle, and paternal grandmother; Hypertension in her maternal aunt, maternal grandmother, and paternal grandmother; and Miscarriages / [place] in her cousin. Social History:  reports that she has been smoking Cigarettes.  She has been smoking about .5 packs per day. She has never used smokeless tobacco. She reports that she drinks alcohol. She reports that she uses illicit drugs (Marijuana).   Prenatal Transfer Tool  Maternal Diabetes: No Genetic Screening: Normal Maternal Ultrasounds/Referrals: Normal Fetal Ultrasounds or other Referrals:  None Maternal Substance Abuse:  Yes:  Type: Marijuana Significant Maternal Medications:  None Significant Maternal Lab Results:  None Other Comments:  None  Review of Systems  Constitutional: Negative.   Gastrointestinal: Positive for abdominal pain. Negative for vomiting.  Neurological: Negative for headaches.  Psychiatric/Behavioral:  Negative for depression.      Last menstrual period 11/15/2010. Maternal Exam:  Uterine Assessment: Contraction strength is firm.  Contraction duration is 60 seconds. Contraction frequency is regular.   Abdomen: Estimated fetal weight is 8#.   Fetal presentation: vertex  Introitus: Normal vagina.  Pelvis: adequate for delivery.   Cervix: Cervix evaluated by digital exam.   Cx 8/100/bulging, vtx -2  Fetal Exam Fetal Monitor Review: Mode: ultrasound.   Baseline rate: 140-145.  Variability: moderate (6-25 bpm).   Pattern: accelerations present and no decelerations.    Fetal State Assessment: Category I - tracings are normal.     Physical Exam  Constitutional: She is oriented to person, place, and time. She appears well-developed and well-nourished. She appears distressed.  HENT:  Head: Normocephalic.  Neck: Normal range of motion.  Cardiovascular: Normal rate and regular rhythm.   Respiratory: Effort normal and breath sounds normal.  GI: Soft. There is no tenderness.  Genitourinary: Vagina normal.  Musculoskeletal: Normal range of motion.  Neurological: She is alert and oriented to person, place, and time.  Skin: Skin is warm and dry.  Psychiatric: Thought content normal.       Anxious    Prenatal labs: ABO, Rh: O/Positive/-- (11/14 0000) Antibody: Negative (11/14 0000) Rubella: Immune (11/14 0000) RPR: Nonreactive (11/14 0000)  HBsAg: Negative (11/14 0000)  HIV: Non-reactive (11/14 0000)  GBS: Negative (06/04 0000)  2 hr glucola 75/121/102  Assessment/Plan: G3P1011 @ [redacted]w[redacted]d in active labor with reassuring FHR IV Stadol Expect SVD   Johann Gascoigne 09/18/2011, 10:37 PM

## 2011-09-18 NOTE — Anesthesia Procedure Notes (Signed)
Epidural Patient location during procedure: OB Start time: 09/18/2011 11:21 PM Reason for block: procedure for pain  Staffing Performed by: anesthesiologist   Preanesthetic Checklist Completed: patient identified, site marked, surgical consent, pre-op evaluation, timeout performed, IV checked, risks and benefits discussed and monitors and equipment checked  Epidural Patient position: sitting Prep: site prepped and draped and DuraPrep Patient monitoring: continuous pulse ox and blood pressure Approach: midline Injection technique: LOR air  Needle:  Needle type: Tuohy  Needle gauge: 17 G Needle length: 9 cm Needle insertion depth: 7 cm Catheter type: closed end flexible Catheter size: 19 Gauge Catheter at skin depth: 12 cm Test dose: negative  Assessment Events: blood not aspirated, injection not painful, no injection resistance, negative IV test and no paresthesia  Additional Notes Discussed risk of headache, infection, bleeding, nerve injury and failed or incomplete block.  Patient voices understanding and wishes to proceed.

## 2011-09-19 ENCOUNTER — Encounter (HOSPITAL_COMMUNITY): Payer: Self-pay | Admitting: *Deleted

## 2011-09-19 LAB — TYPE AND SCREEN
ABO/RH(D): O POS
Antibody Screen: NEGATIVE

## 2011-09-19 LAB — ABO/RH: ABO/RH(D): O POS

## 2011-09-19 MED ORDER — OXYCODONE-ACETAMINOPHEN 5-325 MG PO TABS
1.0000 | ORAL_TABLET | ORAL | Status: DC | PRN
  Administered 2011-09-19 – 2011-09-20 (×4): 1 via ORAL
  Filled 2011-09-19 (×2): qty 1
  Filled 2011-09-19: qty 2
  Filled 2011-09-19: qty 1

## 2011-09-19 MED ORDER — IBUPROFEN 600 MG PO TABS
600.0000 mg | ORAL_TABLET | Freq: Four times a day (QID) | ORAL | Status: DC
  Administered 2011-09-19 – 2011-09-20 (×6): 600 mg via ORAL
  Filled 2011-09-19 (×6): qty 1

## 2011-09-19 MED ORDER — ONDANSETRON HCL 4 MG/2ML IJ SOLN: 4.0000 mg | INTRAMUSCULAR | Status: DC | PRN

## 2011-09-19 MED ORDER — WITCH HAZEL-GLYCERIN EX PADS: 1.0000 "application " | MEDICATED_PAD | CUTANEOUS | Status: DC | PRN

## 2011-09-19 MED ORDER — LANOLIN HYDROUS EX OINT: TOPICAL_OINTMENT | CUTANEOUS | Status: DC | PRN

## 2011-09-19 MED ORDER — DIBUCAINE 1 % RE OINT: 1.0000 "application " | TOPICAL_OINTMENT | RECTAL | Status: DC | PRN

## 2011-09-19 MED ORDER — ONDANSETRON HCL 4 MG PO TABS: 4.0000 mg | ORAL_TABLET | ORAL | Status: DC | PRN

## 2011-09-19 MED ORDER — SIMETHICONE 80 MG PO CHEW
80.0000 mg | CHEWABLE_TABLET | ORAL | Status: DC | PRN
  Administered 2011-09-19 (×2): 80 mg via ORAL

## 2011-09-19 MED ORDER — TETANUS-DIPHTH-ACELL PERTUSSIS 5-2.5-18.5 LF-MCG/0.5 IM SUSP
0.5000 mL | Freq: Once | INTRAMUSCULAR | Status: AC
  Administered 2011-09-20: 0.5 mL via INTRAMUSCULAR
  Filled 2011-09-19: qty 0.5

## 2011-09-19 MED ORDER — BENZOCAINE-MENTHOL 20-0.5 % EX AERO: 1.0000 "application " | INHALATION_SPRAY | CUTANEOUS | Status: DC | PRN

## 2011-09-19 MED ORDER — ZOLPIDEM TARTRATE 5 MG PO TABS: 5.0000 mg | ORAL_TABLET | Freq: Every evening | ORAL | Status: DC | PRN

## 2011-09-19 NOTE — Progress Notes (Signed)
UR Chart review completed.  

## 2011-09-19 NOTE — Anesthesia Postprocedure Evaluation (Signed)
  Anesthesia Post Note  Patient: Sherry [name]  Procedure(s) Performed: * No procedures listed *  Anesthesia type: Epidural  Patient location: Mother/Baby  Post pain: Pain level controlled  Post assessment: Post-op Vital signs reviewed  Last Vitals:  Filed Vitals:   09/19/11 0830  BP: 105/72  Pulse: 106  Temp: 36.6 C  Resp: 18    Post vital signs: Reviewed  Level of consciousness:alert  Complications: No apparent anesthesia complications

## 2011-09-20 MED ORDER — IBUPROFEN 600 MG PO TABS: 600.0000 mg | ORAL_TABLET | Freq: Four times a day (QID) | ORAL | Status: AC

## 2011-09-20 MED ORDER — SIMETHICONE 80 MG PO CHEW
160.0000 mg | CHEWABLE_TABLET | Freq: Four times a day (QID) | ORAL | Status: DC
  Administered 2011-09-20: 160 mg via ORAL

## 2011-09-20 MED ORDER — ACETAMINOPHEN-CODEINE 300-30 MG PO TABS: 1.0000 | ORAL_TABLET | ORAL | Status: AC | PRN

## 2011-09-20 NOTE — Clinical Social Work Maternal (Signed)
    Clinical Social Work Department PSYCHOSOCIAL ASSESSMENT - MATERNAL/CHILD 09/20/2011  Patient:  Sherry [name], Sherry [name]  Account Number:  [id number]  Admit Date:  09/18/2011  Marjo Bicker Name:   Teryl Lucy    Clinical Social Worker:  Andy Gauss   Date/Time:  09/20/2011 10:36 AM  Date Referred:  09/20/2011   Referral source  CN  CN     Referred reason  Substance Abuse   Other referral source:    I:  FAMILY / HOME ENVIRONMENT Child's legal guardian:  PARENT  Guardian - Name Guardian - Age Guardian - Address  [place] Cubillos [address]; [place], [place] [address]  [name] 27 (same as above)   Other household support members/support persons Name Relationship DOB   SON 08/12/2004   Other support:    II  PSYCHOSOCIAL DATA Information Source:  Patient Interview  Event organiser Employment:   Financial resources:  Medicaid If Medicaid - County:  [employer] Other  [facility]  Food Stamps   School / Grade:   Maternity Care Coordinator / Child Services Coordination / Early Interventions:   Westley Gambles  Cultural issues impacting care:    III  STRENGTHS Strengths  Adequate Resources  Home prepared for Child (including basic supplies)  Supportive family/friends   Strength comment:    IV  RISK FACTORS AND CURRENT PROBLEMS Current Problem:  None   Risk Factor & Current Problem Patient Issue Family Issue Risk Factor / Current Problem Comment  Substance Abuse Y N Hx of MJ and Etoh use    V  SOCIAL WORK ASSESSMENT Pt admits to smoking MJ, "2 times a week," prior to pregnancy confirmation at 8 weeks.  She also admits to drinking alcohol (Mike[employer] Hard Lemonade), periodically prior to pregnancy.  Once pregnancy was confirmed, pt  states she stopped smoking and drinking immediately.  Pt seemed concerned about reason for consult, as she told Sw that she didn't smoke during the pregnancy.  Sw informed pt of hospital drug testing policy.  Pt expressed confidence that  the results would be negative.  UDS is negative, meconium collection pending.  Pt has good family support and all the necessary supplies for the infant.  Sw observed pt bonding well with the infant and appears appropriate.  Sw will monitor drug screen results and make a referral if needed.      VI SOCIAL WORK PLAN Social Work Plan  No Further Intervention Required / No Barriers to Discharge   Type of pt/family education:   If child protective services report - county:   If child protective services report - date:   Information/referral to community resources comment:   Other social work plan:

## 2011-09-20 NOTE — Discharge Summary (Signed)
Obstetric Discharge Summary Reason for Admission: onset of labor Prenatal Procedures: NST and ultrasound Intrapartum Procedures: spontaneous vaginal delivery Postpartum Procedures: none Complications-Operative and Postpartum: none Hemoglobin  Date Value Range Status  09/18/2011 11.9* 12.0 - 15.0 g/dL Final     HCT  Date Value Range Status  09/18/2011 35.8* 36.0 - 46.0 % Final    Physical Exam:  General: alert, cooperative and appears stated age Lochia: appropriate Uterine Fundus: firm Incision: n/a DVT Evaluation: No evidence of DVT seen on physical exam. Negative Homan's sign.  Discharge Diagnoses: Term Pregnancy-delivered  Discharge Information: Date: 09/20/2011 Activity: pelvic rest Diet: routine Medications: Tylenol #3 and Ibuprofen Condition: stable Instructions: refer to practice specific booklet Discharge to: home Follow-up Information    Follow up with [facility] in 6 weeks.   Contact information:   [address] [place] [address]          Newborn Data: Live born female  Birth Weight: 7 lb 0.2 oz (3181 g) APGAR: 8, 9  Home with mother.  [facility] 09/20/2011, 9:51 AM

## 2011-09-24 ENCOUNTER — Inpatient Hospital Stay (HOSPITAL_COMMUNITY): Admission: RE | Admit: 2011-09-24 | Payer: Medicaid Other | Source: Ambulatory Visit

## 2011-11-13 ENCOUNTER — Other Ambulatory Visit (HOSPITAL_COMMUNITY)
Admission: RE | Admit: 2011-11-13 | Discharge: 2011-11-13 | Disposition: A | Discharge status: Home / Self Care | Payer: Medicaid Other | Source: Ambulatory Visit | Attending: Obstetrics and Gynecology | Admitting: Obstetrics and Gynecology

## 2011-11-13 ENCOUNTER — Other Ambulatory Visit: Payer: Self-pay | Admitting: Adult Health

## 2011-11-13 DIAGNOSIS — Z01419 Encounter for gynecological examination (general) (routine) without abnormal findings: Secondary | ICD-10-CM | POA: Insufficient documentation

## 2011-11-13 DIAGNOSIS — Z113 Encounter for screening for infections with a predominantly sexual mode of transmission: Secondary | ICD-10-CM | POA: Insufficient documentation

## 2012-03-02 IMAGING — US US OB TRANSVAGINAL
1 series · 14 of 28 positions shown · non-contrast
Comparison: None.

CLINICAL DATA: Heavy vaginal bleeding.  6 weeks and 0 days
pregnant by last menstrual period.  No quantitative beta HCG
available at this time.

OBSTETRIC <14 WK US AND TRANSVAGINAL OB US
TECHNIQUE: Both transabdominal and transvaginal ultrasound
examinations were performed for complete evaluation of the
gestation as well as the maternal uterus, adnexal regions, and
pelvic cul-de-sac.

[Series 1: us ob transvaginal · 0.28mm/px · 14 of 77 slices shown]
[im 3/77]
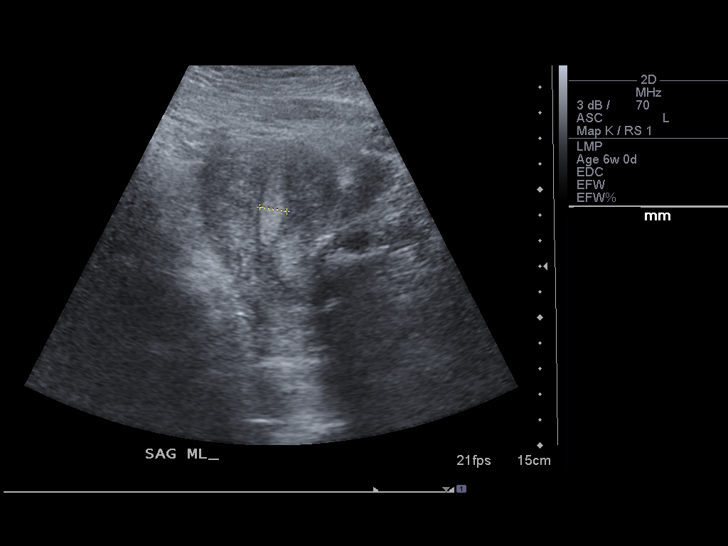
[im 9/77]
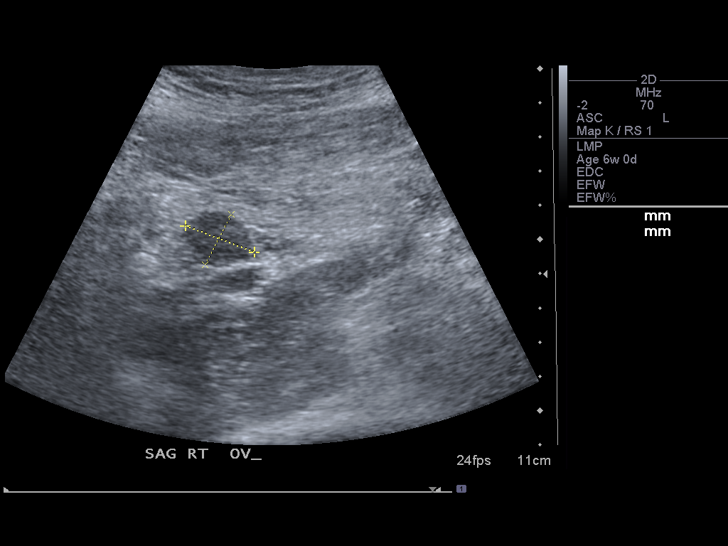
[im 15/77]
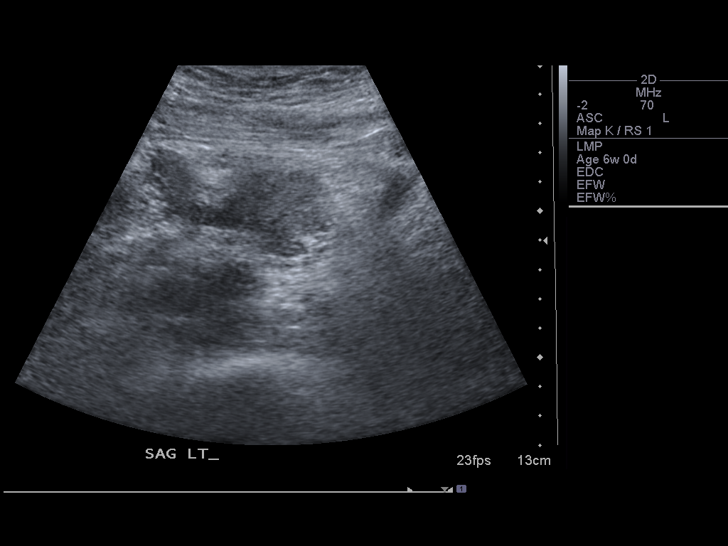
[im 20/77]
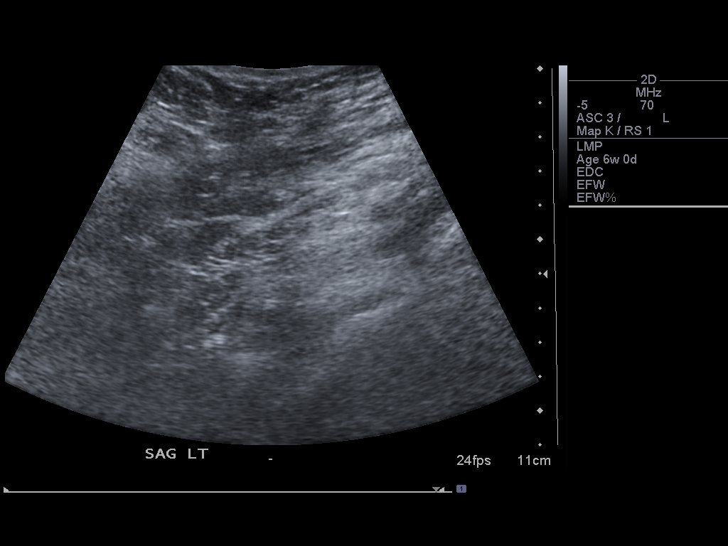
[im 26/77]
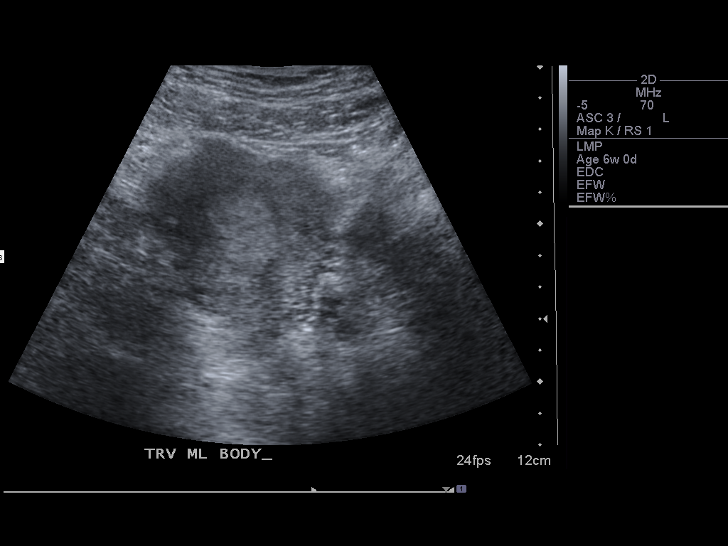
[im 31/77]
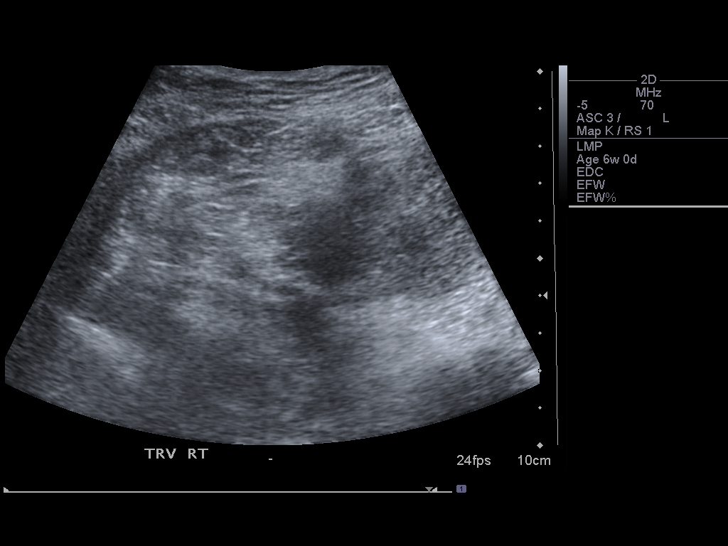
[im 37/77]
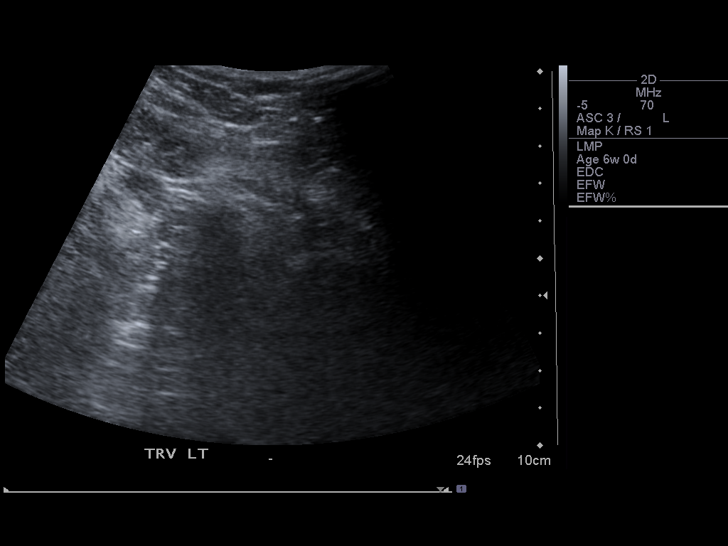
[im 43/77]
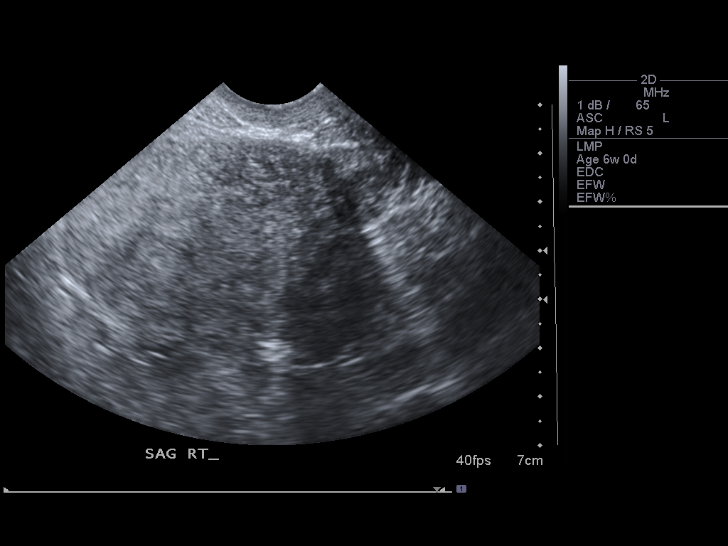
[im 48/77]
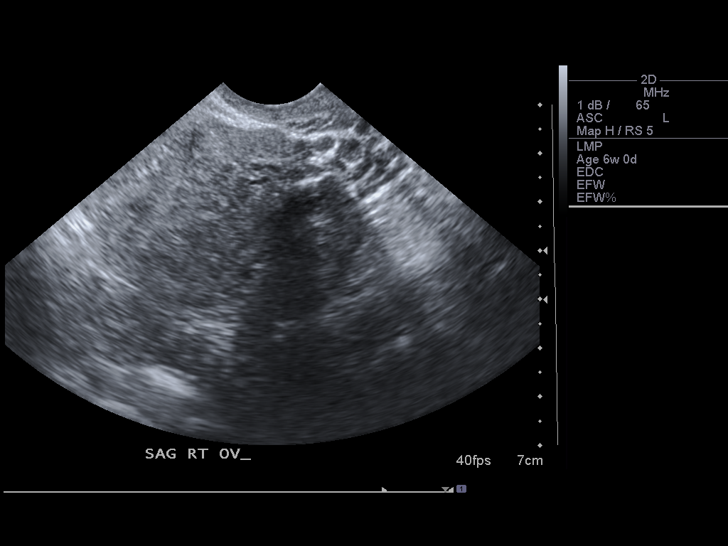
[im 54/77]
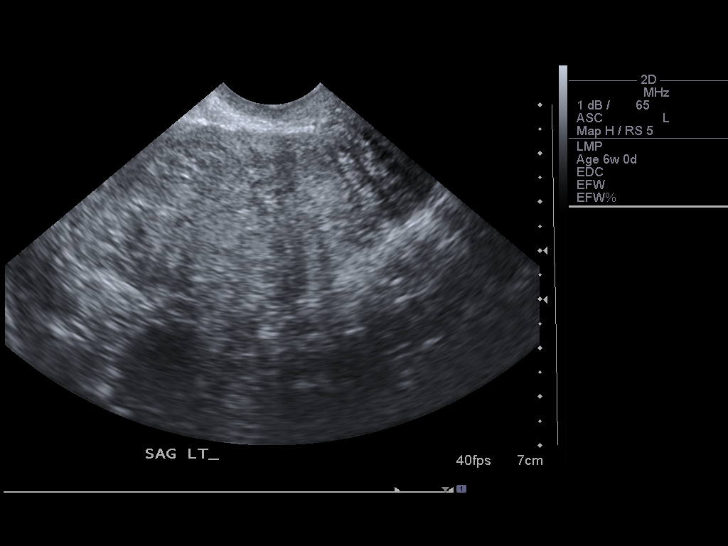
[im 60/77]
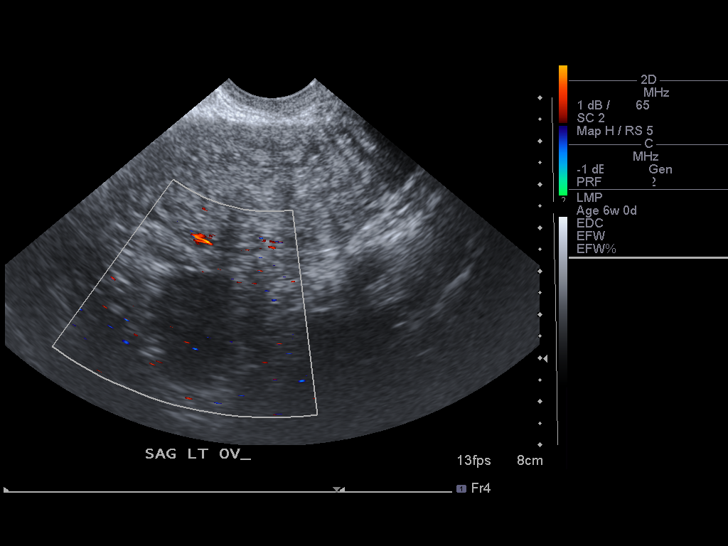
[im 65/77]
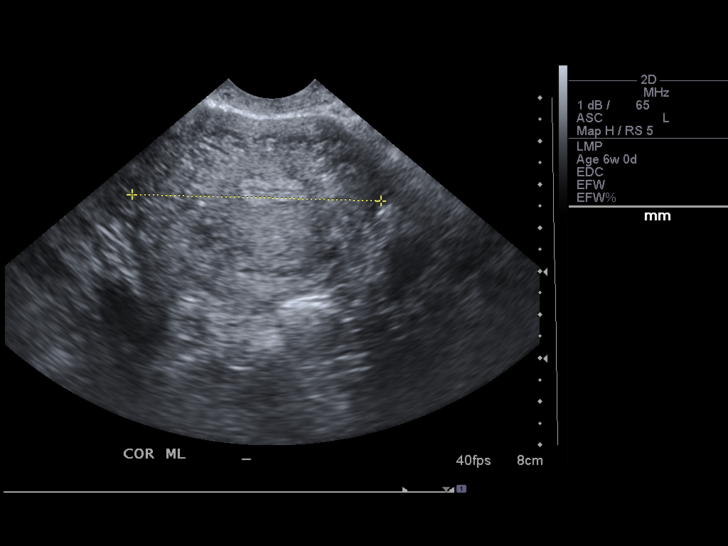
[im 71/77]
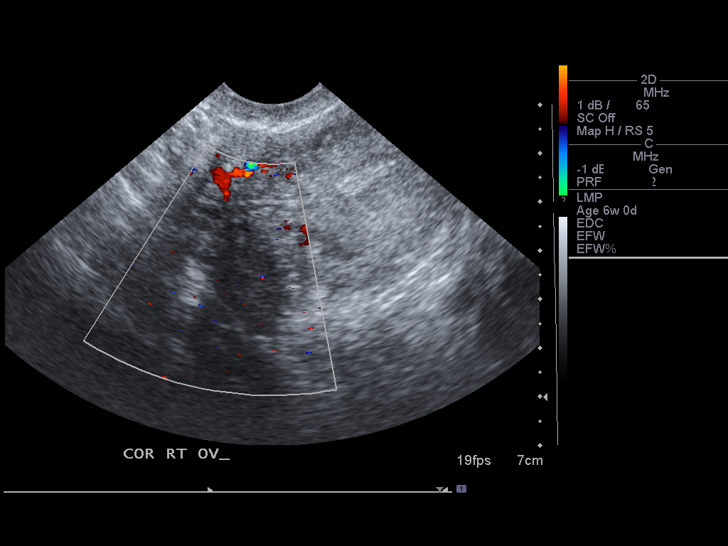
[im 77/77]
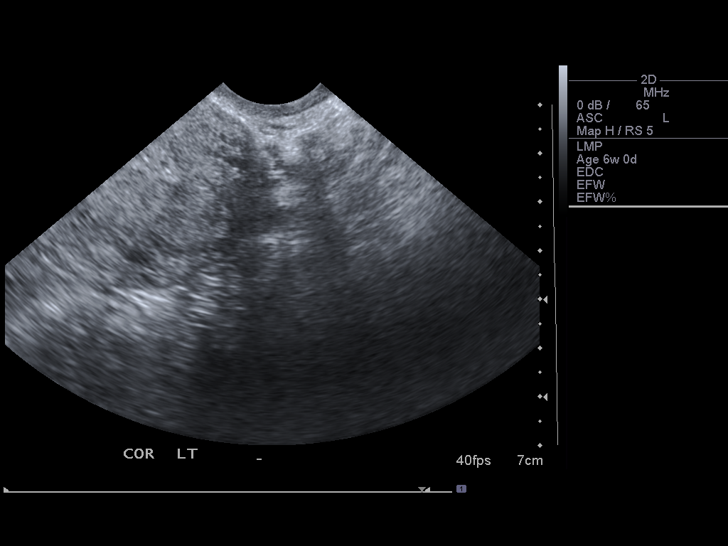

[14 of 28 positions shown; findings below may reference images not displayed]

FINDINGS: Normal appearing uterus and endometrial stripe with no
intrauterine gestational sac seen.  The endometrial stripe measures
6.2 mm in maximum thickness transvaginally.  Normal appearing
maternal ovaries.  No adnexal masses or free peritoneal fluid seen.
IMPRESSION: Normal appearing uterus and ovaries with no intrauterine or
extrauterine gestation visualized.  This is most likely due to a
spontaneous abortion.  However, an early normal intrauterine
pregnancy or nonvisualized ectopic pregnancy cannot be excluded.
Correlation with serial quantitative beta HCG determinations is
recommended.

## 2014-01-19 ENCOUNTER — Encounter (HOSPITAL_COMMUNITY): Payer: Self-pay | Admitting: *Deleted

## 2014-08-25 ENCOUNTER — Other Ambulatory Visit: Payer: Self-pay | Admitting: Neurology

## 2014-08-25 DIAGNOSIS — C8590 Non-Hodgkin lymphoma, unspecified, unspecified site: Secondary | ICD-10-CM

## 2014-08-25 DIAGNOSIS — R2231 Localized swelling, mass and lump, right upper limb: Secondary | ICD-10-CM

## 2014-08-28 ENCOUNTER — Ambulatory Visit (HOSPITAL_COMMUNITY): Admission: RE | Admit: 2014-08-28 | Payer: Medicaid Other | Source: Ambulatory Visit

## 2014-09-03 ENCOUNTER — Ambulatory Visit (HOSPITAL_COMMUNITY)
Admission: RE | Admit: 2014-09-03 | Discharge: 2014-09-03 | Disposition: A | Discharge status: Home / Self Care | Payer: Medicaid Other | Source: Ambulatory Visit | Attending: Neurology | Admitting: Neurology

## 2014-09-03 DIAGNOSIS — M25831 Other specified joint disorders, right wrist: Secondary | ICD-10-CM | POA: Diagnosis not present

## 2014-09-03 DIAGNOSIS — R2231 Localized swelling, mass and lump, right upper limb: Secondary | ICD-10-CM | POA: Diagnosis present

## 2014-09-03 DIAGNOSIS — C8590 Non-Hodgkin lymphoma, unspecified, unspecified site: Secondary | ICD-10-CM

## 2014-10-14 ENCOUNTER — Other Ambulatory Visit: Payer: Self-pay | Admitting: Orthopedic Surgery

## 2014-12-16 ENCOUNTER — Encounter: Payer: Self-pay | Admitting: Advanced Practice Midwife

## 2014-12-16 ENCOUNTER — Ambulatory Visit (INDEPENDENT_AMBULATORY_CARE_PROVIDER_SITE_OTHER): Payer: Medicaid Other | Admitting: Advanced Practice Midwife

## 2014-12-16 VITALS — BP 104/64 | HR 96 | Ht 63.0 in | Wt 181.0 lb

## 2014-12-16 DIAGNOSIS — Z3040 Encounter for surveillance of contraceptives, unspecified: Secondary | ICD-10-CM

## 2014-12-16 DIAGNOSIS — Z3049 Encounter for surveillance of other contraceptives: Secondary | ICD-10-CM

## 2014-12-16 DIAGNOSIS — Z30017 Encounter for initial prescription of implantable subdermal contraceptive: Secondary | ICD-10-CM | POA: Insufficient documentation

## 2014-12-16 DIAGNOSIS — Z3046 Encounter for surveillance of implantable subdermal contraceptive: Secondary | ICD-10-CM

## 2014-12-16 DIAGNOSIS — Z3202 Encounter for pregnancy test, result negative: Secondary | ICD-10-CM | POA: Diagnosis not present

## 2014-12-16 LAB — POCT URINE PREGNANCY: Preg Test, Ur: NEGATIVE

## 2014-12-16 NOTE — Progress Notes (Signed)
Sherry [name] 34 y.o. Filed Vitals:   12/16/14 1011  BP: 104/64  Pulse: 96    Past Medical History  Diagnosis Date  . HSV-2 infection   . Hodgkin's lymphoma   . Non Hodgkin's lymphoma 2003    Family History  Problem Relation Age of Onset  . Diabetes Maternal Aunt   . Hypertension Maternal Aunt   . Diabetes Maternal Uncle   . Arthritis Maternal Grandmother   . Cancer Maternal Grandmother     breast  . Diabetes Maternal Grandmother   . Hypertension Maternal Grandmother   . Alcohol abuse Maternal Grandfather   . Diabetes Paternal Grandmother   . Hypertension Paternal Grandmother   . Miscarriages / [place] Cousin     Social History   Social History  . Marital Status: Single    Spouse Name: N/A  . Number of Children: N/A  . Years of Education: N/A   Occupational History  . Not on file.   Social History Main Topics  . Smoking status: Current Every Day Smoker -- 0.50 packs/day    Types: Cigarettes  . Smokeless tobacco: Never Used  . Alcohol Use: Yes     Comment: occassional  . Drug Use: No     Comment: positive uds  . Sexual Activity: Yes    Birth Control/ Protection: Implant   Other Topics Concern  . Not on file   Social History Narrative    HPI:  Sherry [name] is here for Nexplanon removal and reinsertion.  She was given informed consent for removal of her Nexplanon and reinsertion of a new one.A signed copy is in the chart.  Appropriate time out taken. Nexlanon site (left  arm) identified and thea area was prepped in usual sterile fashon. 2 cc of 1% lidocaine was used to anesthetize the area starting with the distal end of the implant. A small stab incision was made right beside the implant on the distal portion.  The Nexplanon rod was grasped using hemostats and removed without difficulty.  There was less than 3 cc blood loss. There were no complications. Next, the area was cleansed again and the new Nexplanon was inserted without difficulty.  Steri strips  and a pressure bandage were applied.  Pt was instructed to remove pressure bandage in a few hours, and keep insertion site covered with a bandaid for 3 days.  Follow-up scheduled PRN problems  Sherry [name],Sherry [name] 12/16/2014 10:39 AM

## 2016-11-15 ENCOUNTER — Encounter: Payer: BLUE CROSS/BLUE SHIELD | Admitting: Adult Health

## 2016-11-29 ENCOUNTER — Other Ambulatory Visit: Payer: BLUE CROSS/BLUE SHIELD | Admitting: Adult Health

## 2016-12-04 ENCOUNTER — Encounter: Payer: BLUE CROSS/BLUE SHIELD | Admitting: Adult Health

## 2016-12-12 ENCOUNTER — Other Ambulatory Visit: Payer: BLUE CROSS/BLUE SHIELD | Admitting: Adult Health

## 2016-12-18 ENCOUNTER — Other Ambulatory Visit: Payer: BLUE CROSS/BLUE SHIELD | Admitting: Adult Health

## 2017-04-15 IMAGING — US US EXTREM UP *R* LTD
1 series · 14 of 23 positions shown · non-contrast
Comparison: None

CLINICAL DATA: Soft tissue mass in the right wrist

EXAM:
ULTRASOUND right UPPER EXTREMITY LIMITED
TECHNIQUE: Ultrasound examination of the upper extremity soft tissues was
performed in the area of clinical concern.

[Series 1: us extrem up *right* ltd · 0.04mm/px · 14 of 23 slices shown]
[im 1/23]
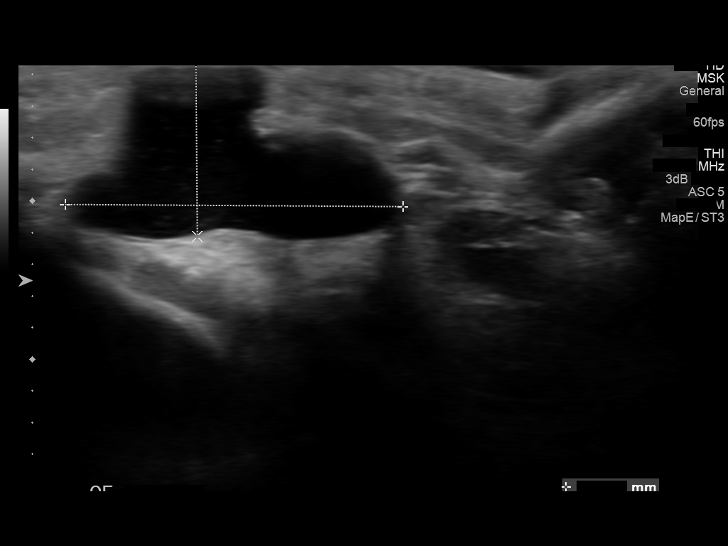
[im 3/23]
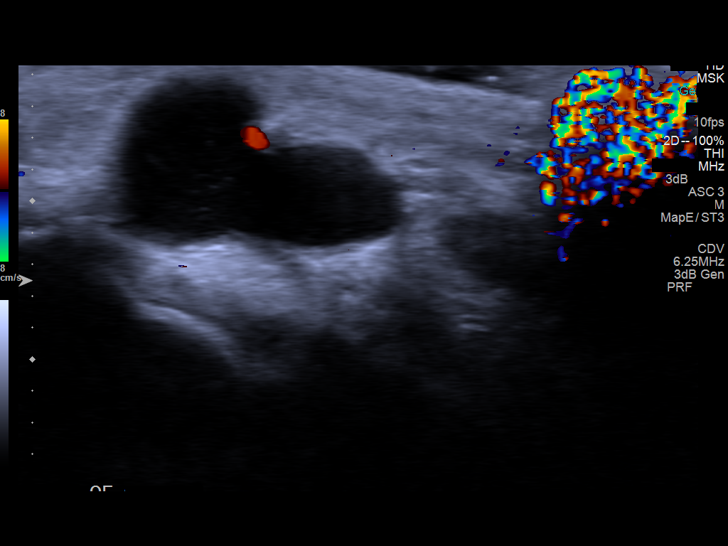
[im 5/23]
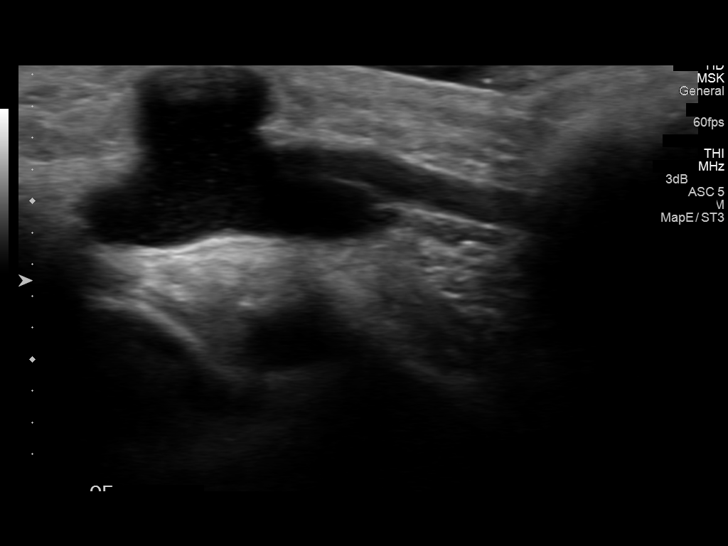
[im 6/23]
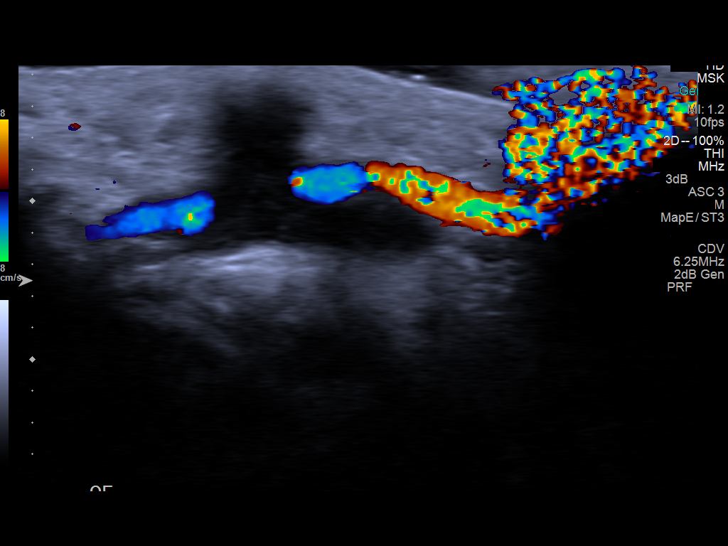
[im 8/23]
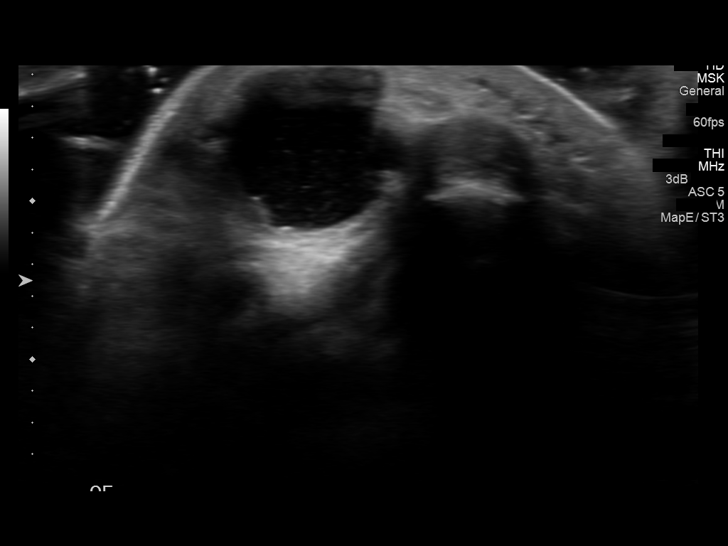
[im 10/23]
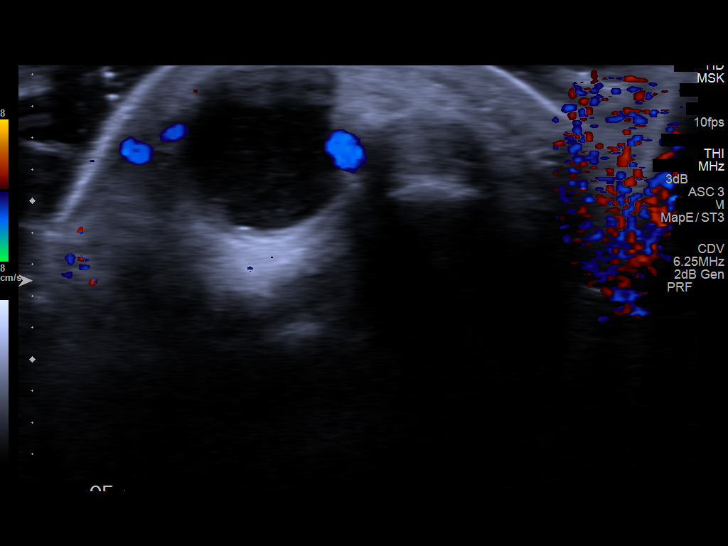
[im 11/23]
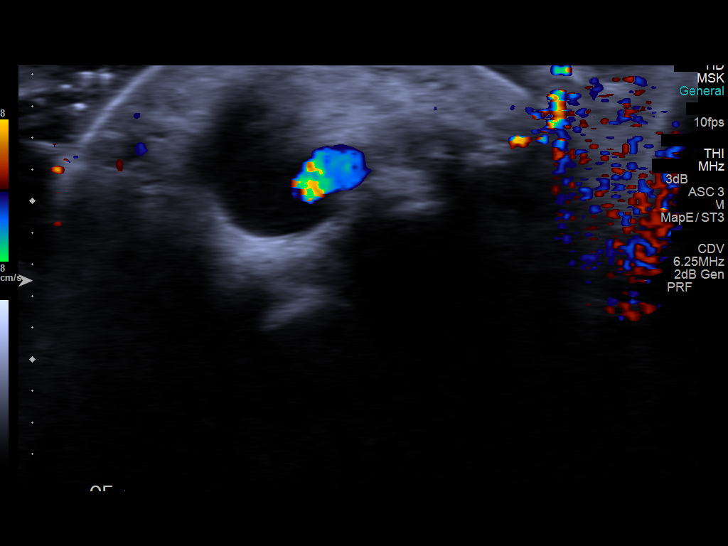
[im 13/23]
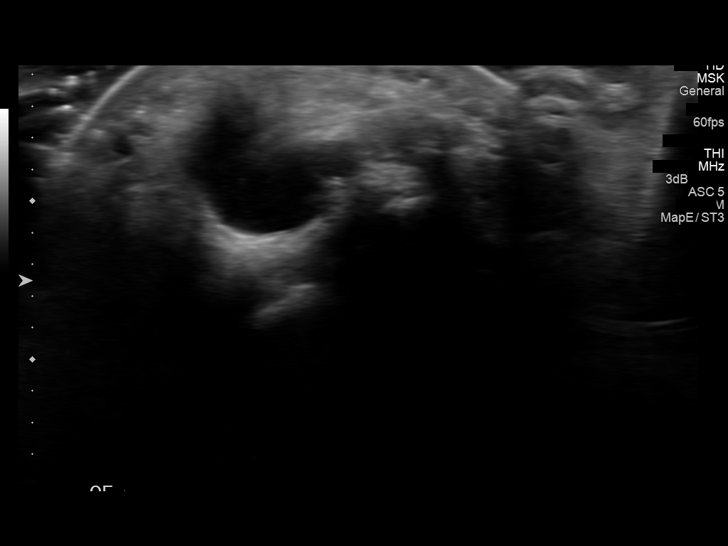
[im 14/23]
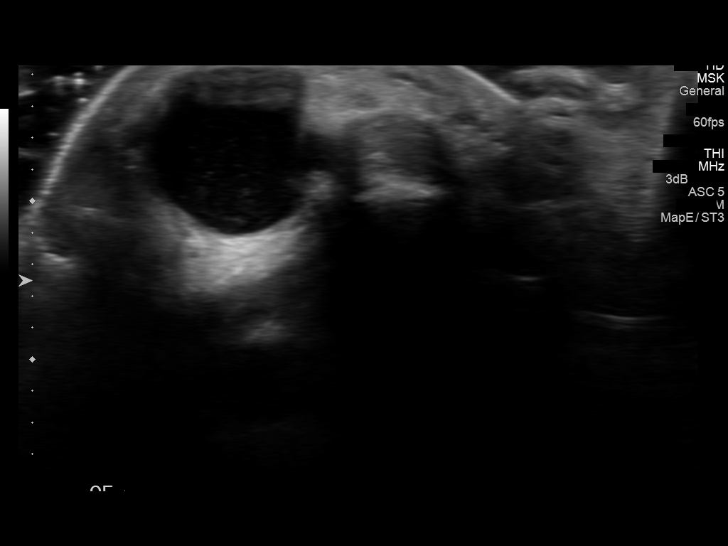
[im 16/23]
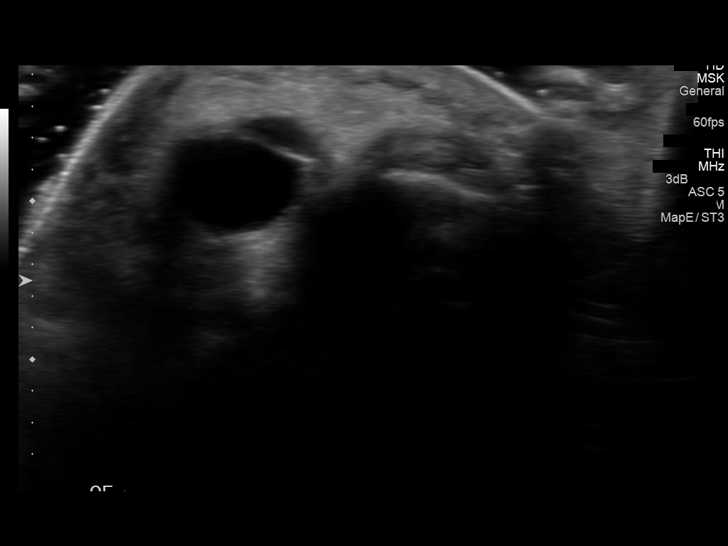
[im 18/23]
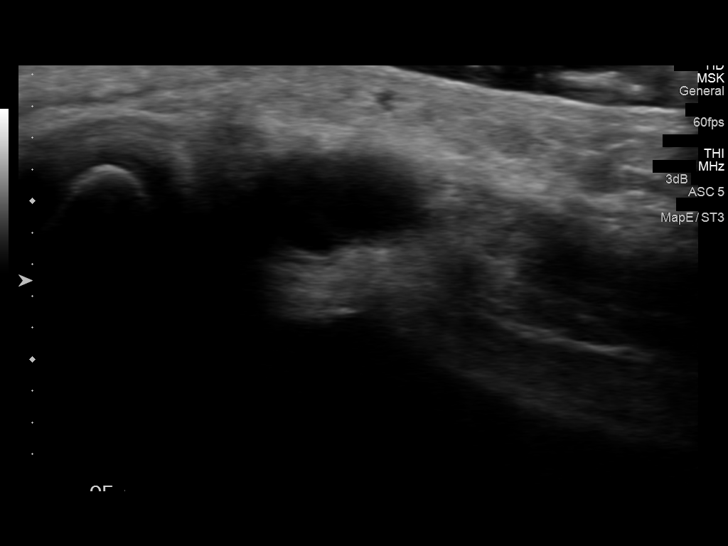
[im 19/23]
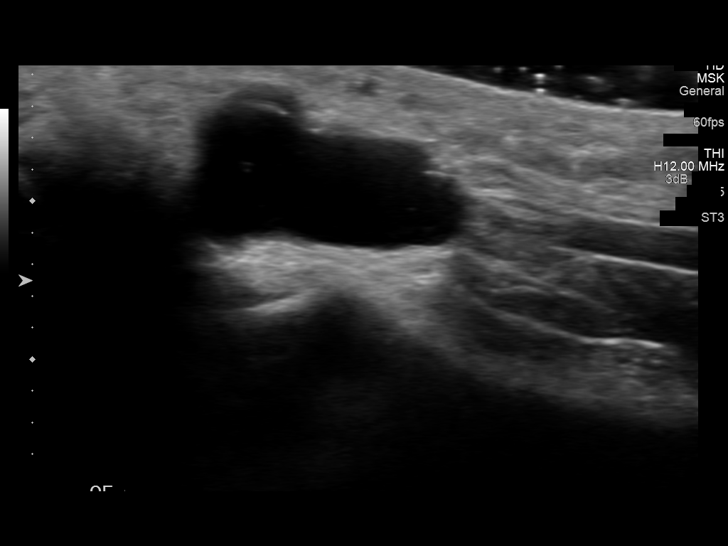
[im 21/23]
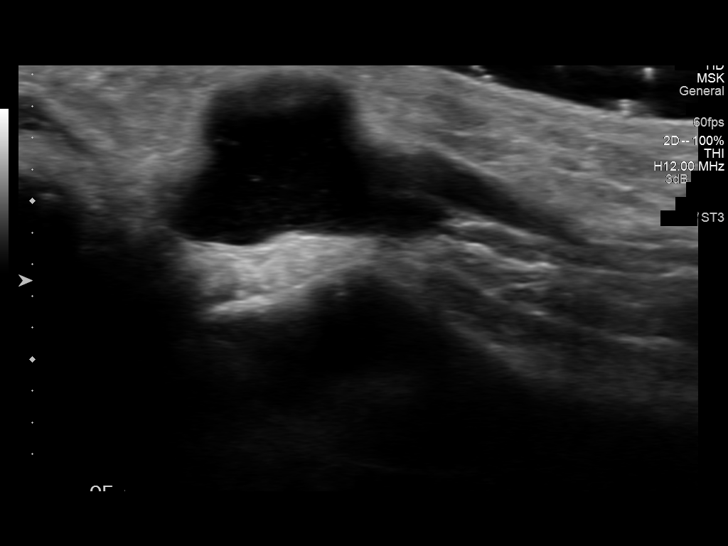
[im 23/23]
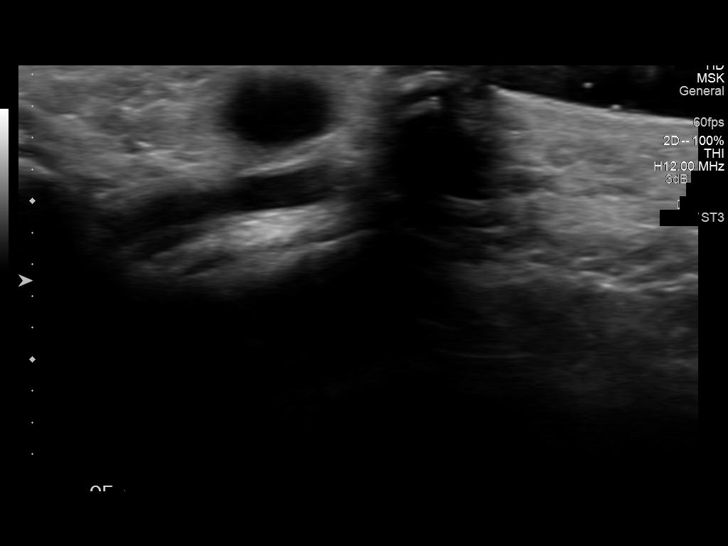

[14 of 23 positions shown; findings below may reference images not displayed]

FINDINGS: In the area of clinical concern, there is a somewhat lobulated soft
tissue mass measuring 2.1 x 1.1 cm. No significant vascularity is
noted. The adjacent artery and vein course around the lesion. Mild
increased echogenicity is noted within which may represent some
internal debris.
IMPRESSION: Somewhat lobulated cystic appearing lesion adjacent to the distal
radius no significant vascularity is noted which raises suspicion
for a ganglion cyst. The possibility of a more aggressive lesion
however could not be totally excluded on the basis of this exam.

## 2018-01-21 ENCOUNTER — Other Ambulatory Visit: Payer: BLUE CROSS/BLUE SHIELD | Admitting: Adult Health

## 2018-04-16 ENCOUNTER — Other Ambulatory Visit: Payer: BLUE CROSS/BLUE SHIELD | Admitting: Obstetrics & Gynecology

## 2018-05-21 ENCOUNTER — Other Ambulatory Visit: Payer: Self-pay | Admitting: Obstetrics & Gynecology

## 2018-08-01 ENCOUNTER — Telehealth: Payer: Self-pay | Admitting: Advanced Practice Midwife

## 2018-08-01 NOTE — Telephone Encounter (Signed)
Pt calling to have an appt to have her nexplanon removed.

## 2018-10-22 ENCOUNTER — Telehealth: Payer: Self-pay | Admitting: Obstetrics and Gynecology

## 2018-10-22 NOTE — Telephone Encounter (Signed)

## 2018-10-23 ENCOUNTER — Ambulatory Visit (INDEPENDENT_AMBULATORY_CARE_PROVIDER_SITE_OTHER): Payer: BC Managed Care – PPO | Admitting: Obstetrics and Gynecology

## 2018-10-23 ENCOUNTER — Other Ambulatory Visit: Payer: Self-pay

## 2018-10-23 ENCOUNTER — Encounter: Payer: Self-pay | Admitting: Obstetrics and Gynecology

## 2018-10-23 DIAGNOSIS — Z3046 Encounter for surveillance of implantable subdermal contraceptive: Secondary | ICD-10-CM

## 2018-10-23 NOTE — Progress Notes (Signed)
     [facility] PROCEDURE NOTE  [name] is a 38 y.o. [phone] here for Nexplanon removal due to expired Nexplanon. Pt does not desire any form of contraception after removal   Nexplanon Removal Patient identified, informed consent performed, consent signed.   Appropriate time out taken. Nexplanon site identified.  Area prepped in usual sterile fashon. One ml of 1% lidocaine was used to anesthetize the area at the distal end of the implant. A small stab incision was made right beside the implant on the distal portion.  The Nexplanon rod was grasped using hemostats and removed without difficulty.  There was minimal blood loss. There were no complications.  3 ml of 1% lidocaine was injected around the incision for post-procedure analgesia.  Steri-strips were applied over the small incision.  A pressure bandage was applied to reduce any bruising.  The patient tolerated the procedure well and was given post procedure instructions.    Arlina Robes, MD, [place] Attending [place] for [place]

## 2018-12-20 ENCOUNTER — Telehealth: Payer: Self-pay | Admitting: Advanced Practice Midwife

## 2018-12-20 NOTE — Telephone Encounter (Signed)

## 2018-12-23 ENCOUNTER — Other Ambulatory Visit: Payer: Self-pay | Admitting: Advanced Practice Midwife

## 2019-01-02 ENCOUNTER — Telehealth: Payer: Self-pay | Admitting: Obstetrics & Gynecology

## 2019-01-02 NOTE — Telephone Encounter (Signed)

## 2019-01-03 ENCOUNTER — Other Ambulatory Visit: Payer: Self-pay

## 2019-01-03 ENCOUNTER — Ambulatory Visit (INDEPENDENT_AMBULATORY_CARE_PROVIDER_SITE_OTHER): Payer: BC Managed Care – PPO | Admitting: *Deleted

## 2019-01-03 VITALS — BP 132/84 | HR 103

## 2019-01-03 DIAGNOSIS — Z3201 Encounter for pregnancy test, result positive: Secondary | ICD-10-CM

## 2019-01-03 LAB — POCT URINE PREGNANCY: Preg Test, Ur: POSITIVE — AB

## 2019-01-03 NOTE — Progress Notes (Addendum)
   NURSE VISIT- PREGNANCY CONFIRMATION   SUBJECTIVE:  Sherry [name] is a 38 y.o. [phone] female at [redacted]w[redacted]d by certain LMP of Patient's last menstrual period was 11/27/2018 (exact date). Here for pregnancy confirmation.  Home pregnancy test: positive x 4  She reports no complaints.  She is not taking prenatal vitamins.    OBJECTIVE:  LMP 11/27/2018 (Exact Date)   Breastfeeding No   Appears well, in no apparent distress OB History  Gravida Para Term Preterm AB Living  4 2 2   1 2   SAB TAB Ectopic Multiple Live Births  1       2    # Outcome Date GA Lbr Len/2nd Weight Sex Delivery Anes PTL Lv  4 Current           3 Term 09/19/11 [redacted]w[redacted]d  7 lb 0.2 oz (3.181 kg) F Vag-Vacuum EPI  LIV  2 Term 2006 [redacted]w[redacted]d  6 lb 12 oz (3.062 kg) M Vag-Spont EPI  LIV  1 SAB 2004            Results for orders placed or performed in visit on 01/03/19 (from the past 24 hour(s))  POCT urine pregnancy   Collection Time: 01/03/19 12:21 PM  Result Value Ref Range   Preg Test, Ur Positive (A) Negative    ASSESSMENT: Positive pregnancy test with certain LMP    PLAN: Schedule for dating ultrasound in 2 weeks Prenatal vitamins: plans to begin OTC ASAP   Nausea medicines: not currently needed   OB packet given: Yes  Rash, Celene Squibb  01/03/2019 12:31 PM   Attestation of Attending Supervision of Nursing Visit Encounter: Evaluation and management procedures were performed by the nursing staff under my supervision and collaboration.  I have reviewed the nurse's note and chart, and I agree with the management and plan.  Jacelyn Grip MD Attending Physician for the [facility] for [facility] 01/03/2019 12:52 PM

## 2019-01-14 ENCOUNTER — Telehealth: Payer: Self-pay | Admitting: Obstetrics & Gynecology

## 2019-01-14 ENCOUNTER — Other Ambulatory Visit: Payer: Self-pay | Admitting: Obstetrics & Gynecology

## 2019-01-14 DIAGNOSIS — O3680X0 Pregnancy with inconclusive fetal viability, not applicable or unspecified: Secondary | ICD-10-CM

## 2019-01-14 NOTE — Telephone Encounter (Signed)
Tried to call the patient to remind her of her appointment/restrictions, call can not be completed at this time.

## 2019-01-15 ENCOUNTER — Ambulatory Visit (INDEPENDENT_AMBULATORY_CARE_PROVIDER_SITE_OTHER): Payer: BC Managed Care – PPO

## 2019-01-15 ENCOUNTER — Other Ambulatory Visit: Payer: Self-pay

## 2019-01-15 DIAGNOSIS — Z3A01 Less than 8 weeks gestation of pregnancy: Secondary | ICD-10-CM

## 2019-01-15 DIAGNOSIS — O3680X0 Pregnancy with inconclusive fetal viability, not applicable or unspecified: Secondary | ICD-10-CM | POA: Diagnosis not present

## 2019-01-15 NOTE — Progress Notes (Signed)
[place] 7 wks,single IUP,FHR 104 bpm,normal left ovary,simple right corpus luteal cyst 2.9 x 2.8 x 2.4 cm,crl 7.27 mm,mult.fibroids,largest fibroids (#1) anterior 3.1 x 2.4 x 2.4 cm,(#2) anterior left 3.3 x 2 x 1.8 cm

## 2019-01-23 ENCOUNTER — Other Ambulatory Visit: Payer: BC Managed Care – PPO

## 2019-02-05 ENCOUNTER — Telehealth: Payer: Self-pay | Admitting: *Deleted

## 2019-02-05 NOTE — Telephone Encounter (Signed)
Pt had some spotting last night when wiping. Pt did have sex and had a BM that same day. Hasn't noticed any spotting today. I advised everything sounds good at this point. Advised no sex for 7 days from the last time she wipes any color. Drink plenty of fluids and take it easy. Pt voiced understanding. [place]

## 2019-02-05 NOTE — Telephone Encounter (Signed)
Patient left message that she had some spotting when she wiped last night. She has not see any since then. She is now worried. Wants a call back to discuss.

## 2019-02-10 ENCOUNTER — Telehealth: Payer: Self-pay | Admitting: *Deleted

## 2019-02-10 NOTE — Telephone Encounter (Signed)
Pt left message that she is spotting again and it is dark blood. Last week it was just pink spotting. She is scared and wants to speak to someone.

## 2019-02-10 NOTE — Telephone Encounter (Signed)
I spoke with patient she is seeing some dark brownish blood when she wipes. It started this morning. No recent sex or straining. Advised patient to keep an eye on it and if she starts bleeding like a period or having pain to let us know or go to ER if not during business hours.

## 2019-02-12 ENCOUNTER — Telehealth: Payer: Self-pay | Admitting: *Deleted

## 2019-02-12 NOTE — Telephone Encounter (Signed)
LMOVM returning patient's call.  Informed our office will be closed tomorrow and Friday and is she is having increased vaginal bleeding or pain, to seek care at the ER.  Informed we could not access records at [facility] so they would need to be faxed.

## 2019-02-12 NOTE — Telephone Encounter (Signed)
Patient left message that she is bleeding again.  Spoke to 3 nurses and went to ER. Had a miscarriage.

## 2019-02-17 ENCOUNTER — Telehealth: Payer: Self-pay | Admitting: *Deleted

## 2019-02-17 NOTE — Telephone Encounter (Signed)
Patient called stating missed call last week and was wanting call back ASAP. She was told that she was having a miscarriage and needs to discuss this matter.

## 2019-02-17 NOTE — Telephone Encounter (Signed)
Pt went to [facility] on Tuesday due to bleeding in pregnancy and pain. Pt had a [place] and had blood work. Pt's quant was 9636.0 on 02/11/19. Pt wants an in person visit to discuss a possible miscarriage. Call transferred to front desk for appt. [place]

## 2019-02-18 ENCOUNTER — Encounter: Payer: Self-pay | Admitting: Adult Health

## 2019-02-18 ENCOUNTER — Other Ambulatory Visit: Payer: Self-pay

## 2019-02-18 ENCOUNTER — Ambulatory Visit (INDEPENDENT_AMBULATORY_CARE_PROVIDER_SITE_OTHER): Payer: BC Managed Care – PPO | Admitting: Adult Health

## 2019-02-18 ENCOUNTER — Ambulatory Visit: Payer: BC Managed Care – PPO | Admitting: Women's Health

## 2019-02-18 VITALS — BP 136/83 | HR 93 | Ht 63.0 in | Wt 182.8 lb

## 2019-02-18 DIAGNOSIS — O039 Complete or unspecified spontaneous abortion without complication: Secondary | ICD-10-CM

## 2019-02-18 DIAGNOSIS — Z3201 Encounter for pregnancy test, result positive: Secondary | ICD-10-CM

## 2019-02-18 DIAGNOSIS — R319 Hematuria, unspecified: Secondary | ICD-10-CM | POA: Diagnosis not present

## 2019-02-18 DIAGNOSIS — F329 Major depressive disorder, single episode, unspecified: Secondary | ICD-10-CM | POA: Diagnosis not present

## 2019-02-18 LAB — POCT URINALYSIS DIPSTICK OB
Glucose, UA: NEGATIVE
Ketones, UA: NEGATIVE
Leukocytes, UA: NEGATIVE
Nitrite, UA: NEGATIVE
POC,PROTEIN,UA: NEGATIVE

## 2019-02-18 LAB — POCT URINE PREGNANCY: Preg Test, Ur: POSITIVE — AB

## 2019-02-18 MED ORDER — ESCITALOPRAM OXALATE 10 MG PO TABS: 10.0000 mg | ORAL_TABLET | Freq: Every day | ORAL | 2 refills | Status: DC

## 2019-02-18 NOTE — Patient Instructions (Signed)

## 2019-02-18 NOTE — Progress Notes (Addendum)
  Subjective:     Patient ID: Sherry [name], female   DOB: 08-Nov-1980, 38 y.o.   MRN: [id number]  HPI Sherry is a 38 year old black female, married, in complaining of bleeding with miscarriage. The bleeding started 11/22 and she was seen in ER at [facility] and was told no FHT or FM,and baby stopped growing about 6+weeks, and  her QHCG was 9636.0 there and HGB was 14.0. Blood type is O+. She had documented [place] here 01/15/2019 with +FHM of 104, and was 6+4 weeks then by [place].  She left work early on 11/24 and has not been back was still bleeding and cramping.She got FMLA papers and is supposed to RTW 12/9 she says, as per Freight forwarder. She is upset and says she feels hollow, she says she just does not understand. She got pregnant after having nexplanon removed and having one period. And she is worried about her kids.  Review of Systems Started bleeding 02/09/2019 Seen in ER at [facility] 11/24 +cramping Bleeding stopped today +feels hollow Feels cold and has dizzy spells   Reviewed past medical,surgical, social and family history. Reviewed medications and allergies.     Objective:   Physical Exam BP 136/83 (BP Location: Left Arm, Patient Position: Sitting, Cuff Size: Normal)   Pulse 93   Ht 5\' 3"  (1.6 m)   Wt 182 lb 12.8 oz (82.9 kg)   LMP 11/27/2018 (Exact Date)   BMI 32.38 kg/m  urine dipstick, moderate blood.  UPT+ Skin warm and dry. Lungs: clear to ausculation bilaterally. Cardiovascular: regular rate and rhythm. Pelvic: external genitalia is normal in appearance no lesions, vagina: no bleeding or discharge without odor,urethra has no lesions or masses noted, cervix:smooth and bulbous, uterus: normal size, shape and contour, non tender, no masses felt, adnexa: no masses, +RLQ tenderness noted. Bladder is + tender and no masses felt.  PHQ 9 score is 13, denies being suicidal. Examination chaperoned by [name] LPN.  Tried to explain to her that miscarriages can happen and it  is nobodies fault and that it can not be prevented this early. Face time 25 minutes with 50% counseling and listening to pt.     Assessment:     1. Positive urine pregnancy test   2. Hematuria, unspecified type UA C&S sent  3. Miscarriage Check QHCG,CBC, CMP and TSH  Have get FMLA papers filled out for her.   4. Reactive depression Will rx lexapro Meds ordered this encounter  Medications  . escitalopram (LEXAPRO) 10 MG tablet    Sig: Take 1 tablet (10 mg total) by mouth daily.    Dispense:  30 tablet    Refill:  2    Order Specific Question:   Supervising Provider    Answer:   Florian Buff [[id number]]      Plan:     Follow up in 2 weeks  Review handout on miscarriage

## 2019-02-19 ENCOUNTER — Telehealth: Payer: Self-pay | Admitting: Adult Health

## 2019-02-19 LAB — COMPREHENSIVE METABOLIC PANEL
ALT: 22 IU/L (ref 0–32)
AST: 31 IU/L (ref 0–40)
Albumin/Globulin Ratio: 1.4 (ref 1.2–2.2)
Albumin: 4.5 g/dL (ref 3.8–4.8)
Alkaline Phosphatase: 69 IU/L (ref 39–117)
BUN/Creatinine Ratio: 9 (ref 9–23)
BUN: 6 mg/dL (ref 6–20)
Bilirubin Total: <0.2 mg/dL (ref 0.0–1.2)
CO2: 20 mmol/L (ref 20–29)
Calcium: 9.9 mg/dL (ref 8.7–10.2)
Chloride: 106 mmol/L (ref 96–106)
Creatinine, Ser: 0.69 mg/dL (ref 0.57–1.00)
GFR calc Af Amer: 129 mL/min/{1.73_m2} (ref >59)
GFR calc non Af Amer: 112 mL/min/{1.73_m2} (ref >59)
Globulin, Total: 3.3 g/dL (ref 1.5–4.5)
Glucose: 91 mg/dL (ref 65–99)
Potassium: 5.5 mmol/L — ABNORMAL HIGH (ref 3.5–5.2)
Sodium: 144 mmol/L (ref 134–144)
Total Protein: 7.8 g/dL (ref 6.0–8.5)

## 2019-02-19 LAB — TSH: TSH: 4.65 u[IU]/mL — ABNORMAL HIGH (ref 0.450–4.500)

## 2019-02-19 LAB — URINALYSIS, ROUTINE W REFLEX MICROSCOPIC
Bilirubin, UA: NEGATIVE
Glucose, UA: NEGATIVE
Ketones, UA: NEGATIVE
Leukocytes,UA: NEGATIVE
Nitrite, UA: NEGATIVE
Protein,UA: NEGATIVE
Specific Gravity, UA: 1.008 (ref 1.005–1.030)
Urobilinogen, Ur: 0.2 mg/dL (ref 0.2–1.0)
pH, UA: 7 (ref 5.0–7.5)

## 2019-02-19 LAB — CBC
Hematocrit: 39.5 % (ref 34.0–46.6)
Hemoglobin: 13 g/dL (ref 11.1–15.9)
MCH: 31 pg (ref 26.6–33.0)
MCHC: 32.9 g/dL (ref 31.5–35.7)
MCV: 94 fL (ref 79–97)
Platelets: 232 10*3/uL (ref 150–450)
RBC: 4.2 x10E6/uL (ref 3.77–5.28)
RDW: 13.1 % (ref 11.7–15.4)
WBC: 5.8 10*3/uL (ref 3.4–10.8)

## 2019-02-19 LAB — MICROSCOPIC EXAMINATION
Casts: NONE SEEN /lpf
WBC, UA: NONE SEEN /hpf (ref 0–5)

## 2019-02-19 LAB — BETA HCG QUANT (REF LAB): hCG Quant: 332 m[IU]/mL

## 2019-02-19 NOTE — Telephone Encounter (Signed)
Pt aware that [facility] has dropped to 332, will recheck in 2 weeks, TSH and K slightly elevated, will recheck in 2 weeks.FMLA ready for pick up.

## 2019-02-20 LAB — URINE CULTURE: Organism ID, Bacteria: NO GROWTH

## 2019-02-24 ENCOUNTER — Other Ambulatory Visit: Payer: BC Managed Care – PPO

## 2019-02-24 ENCOUNTER — Encounter: Payer: BC Managed Care – PPO | Admitting: Women's Health

## 2019-02-24 ENCOUNTER — Ambulatory Visit: Payer: BC Managed Care – PPO | Admitting: *Deleted

## 2019-02-24 DIAGNOSIS — Z029 Encounter for administrative examinations, unspecified: Secondary | ICD-10-CM

## 2019-03-03 ENCOUNTER — Telehealth: Payer: Self-pay | Admitting: Adult Health

## 2019-03-03 NOTE — Telephone Encounter (Signed)

## 2019-03-04 ENCOUNTER — Encounter: Payer: Self-pay | Admitting: Adult Health

## 2019-03-04 ENCOUNTER — Ambulatory Visit (INDEPENDENT_AMBULATORY_CARE_PROVIDER_SITE_OTHER): Payer: BC Managed Care – PPO | Admitting: Adult Health

## 2019-03-04 ENCOUNTER — Other Ambulatory Visit: Payer: Self-pay

## 2019-03-04 VITALS — BP 131/86 | HR 102 | Ht 63.0 in | Wt 182.0 lb

## 2019-03-04 DIAGNOSIS — O039 Complete or unspecified spontaneous abortion without complication: Secondary | ICD-10-CM

## 2019-03-04 DIAGNOSIS — Z3202 Encounter for pregnancy test, result negative: Secondary | ICD-10-CM | POA: Insufficient documentation

## 2019-03-04 LAB — POCT URINE PREGNANCY: Preg Test, Ur: NEGATIVE

## 2019-03-04 NOTE — Progress Notes (Signed)
  Subjective:     Patient ID: Sherry [name], female   DOB: 12-30-1980, 38 y.o.   MRN: [id number]  HPI Sherry is a 38 year old black female, married, back in follow up after recent miscarriage and feels much better.   Review of Systems Denies any bleeding cramps or pain Stopped lexapro, is much better just sad but is talking with husband more.  Reviewed past medical,surgical, social and family history. Reviewed medications and allergies.     Objective:   Physical Exam BP 131/86 (BP Location: Left Arm, Patient Position: Sitting, Cuff Size: Normal)   Pulse (!) 102   Ht 5\' 3"  (1.6 m)   Wt 182 lb (82.6 kg)   LMP 11/27/2018 (Exact Date)   Breastfeeding Unknown   BMI 32.24 kg/m   UPT negative Skin warm and warm. Lungs: clear to ausculation bilaterally. Cardiovascular: regular rate and rhythm. Fall risk is moderate PHQ 2 score 0. She does want to get pregnant in future, she thinks     Assessment:     1. Miscarriage   2. Pregnancy examination or test, negative result       Plan:     Check QHCG today, will follow til <5 Continue PNV Follow up prn

## 2019-03-05 LAB — BETA HCG QUANT (REF LAB): hCG Quant: 10 m[IU]/mL

## 2019-03-19 ENCOUNTER — Telehealth: Payer: Self-pay | Admitting: Obstetrics & Gynecology

## 2019-03-19 NOTE — Telephone Encounter (Signed)
Called patient regarding appointment scheduled in our office and advised to come alone to the visit, however, a support person, over age 37, may accompany her to appointment if assistance is needed for safety or care concerns. Otherwise, support persons should remain outside until the visit is complete.   Prescreen questions asked: 1. Any of the following symptoms of COVID such as chills, fever, cough, shortness of breath, muscle pain, diarrhea, rash, vomiting, abdominal pain, red eye, weakness, bruising, bleeding, joint pain, loss of taste or smell, a severe headache, sore throat, fatigue 2. Any exposure to anyone suspected or confirmed of having COVID-19 3. Awaiting test results for COVID-19  Also,to keep you safe, please use the provided hand sanitizer when you enter the office. We are asking everyone in the office to wear a mask to help prevent the spread of germs. If you have a mask of your own, please wear it to your appointment, if not, we are happy to provide one for you.  Thank you for understanding and your cooperation.    [facility] Staff      

## 2019-03-21 NOTE — L&D Delivery Note (Signed)
OB/GYN Faculty Practice Delivery Note  Sherry [name] is a 39 y.o. [id number] s/p vaginal delivery at [redacted]w[redacted]d. She was admitted for IOL secondary to preeclampsia without severe features and FGR.   ROM: 3h 69m with clear fluid GBS Status: negative Maximum Maternal Temperature: 98.55F  Labor Progress: On admission, pt was started on pitocin given favorable cervical exam. AROM for clear fluid at 1500 and pt was noted to have complete cervical dilation at 1840. She then had an uncomplicated delivery s/p a brief second stage.  Delivery Date/Time: 03/14/20 at 1844 Delivery: Called to room and patient was complete and pushing. Head delivered ROA. No nuchal cord present. Shoulder and body delivered in usual fashion. Infant with spontaneous cry, placed on mother's abdomen, dried and stimulated. Cord clamped x 2 after 1-minute delay, and cut by FOB under my direct supervision. Cord blood drawn. Placenta delivered spontaneously with gentle cord traction. Fundus firm with massage and Pitocin. Labia, perineum, vagina, and cervix were inspected, without evidence of lacerations.   Placenta: 3-vessel cord, intact, trailing membranes, sent to L&D Complications: none Lacerations: none EBL: 25 ml Analgesia: epidural  Infant: female  APGARs 9 & 9  weight per medical record  Guillermina City, MD OB/GYN Fellow, Faculty Practice

## 2019-03-24 ENCOUNTER — Other Ambulatory Visit: Payer: BC Managed Care – PPO

## 2019-05-29 ENCOUNTER — Telehealth: Payer: Self-pay | Admitting: *Deleted

## 2019-05-29 ENCOUNTER — Other Ambulatory Visit: Payer: Self-pay

## 2019-05-29 ENCOUNTER — Other Ambulatory Visit: Payer: BC Managed Care – PPO

## 2019-05-29 DIAGNOSIS — Z3201 Encounter for pregnancy test, result positive: Secondary | ICD-10-CM

## 2019-05-29 NOTE — Telephone Encounter (Signed)
Pt scheduled for hcg level per Jenn. We will determine plan one results are back.

## 2019-05-29 NOTE — Telephone Encounter (Signed)
Patient left message that she is pregnant. She talked to the after hours nurse and they told her that she needed to be seen. The front desk told her that we do not have any appointments. Pt has been bleeding since beginning of March.

## 2019-05-30 ENCOUNTER — Telehealth: Payer: Self-pay | Admitting: Adult Health

## 2019-05-30 DIAGNOSIS — Z3201 Encounter for pregnancy test, result positive: Secondary | ICD-10-CM

## 2019-05-30 LAB — BETA HCG QUANT (REF LAB): hCG Quant: 151 m[IU]/mL

## 2019-05-30 NOTE — Telephone Encounter (Signed)
Pt aware that [facility], she is bleeding and will repeat QHCG on Monday

## 2019-06-03 ENCOUNTER — Telehealth: Payer: Self-pay | Admitting: *Deleted

## 2019-06-03 LAB — BETA HCG QUANT (REF LAB): hCG Quant: 32 m[IU]/mL

## 2019-06-03 NOTE — Telephone Encounter (Signed)
Patient informed HCG down to 32.  Will need to repeat in 1 week.  Pt verbalized understanding and appt made.

## 2019-06-03 NOTE — Telephone Encounter (Signed)
Pt called for results. Saw Sherry [name] recently. Thanks!!

## 2019-06-05 ENCOUNTER — Telehealth: Payer: Self-pay | Admitting: Obstetrics & Gynecology

## 2019-06-05 NOTE — Telephone Encounter (Signed)

## 2019-06-09 ENCOUNTER — Other Ambulatory Visit: Payer: Self-pay

## 2019-06-10 ENCOUNTER — Telehealth: Payer: Self-pay | Admitting: Adult Health

## 2019-06-10 LAB — BETA HCG QUANT (REF LAB): hCG Quant: 4 m[IU]/mL

## 2019-06-10 NOTE — Telephone Encounter (Signed)
Pt aware that QHCG down to 4, no more blood work needed

## 2019-08-12 ENCOUNTER — Telehealth: Payer: Self-pay | Admitting: Adult Health

## 2019-08-12 NOTE — Telephone Encounter (Signed)

## 2019-08-13 ENCOUNTER — Encounter: Payer: Self-pay | Admitting: *Deleted

## 2019-08-13 ENCOUNTER — Ambulatory Visit (INDEPENDENT_AMBULATORY_CARE_PROVIDER_SITE_OTHER): Payer: BC Managed Care – PPO | Admitting: *Deleted

## 2019-08-13 VITALS — BP 124/77 | HR 100 | Ht 63.0 in | Wt 179.0 lb

## 2019-08-13 DIAGNOSIS — Z3201 Encounter for pregnancy test, result positive: Secondary | ICD-10-CM | POA: Diagnosis not present

## 2019-08-13 LAB — POCT URINE PREGNANCY: Preg Test, Ur: POSITIVE — AB

## 2019-08-13 MED ORDER — PROMETHAZINE HCL 25 MG PO TABS
25.0000 mg | ORAL_TABLET | Freq: Four times a day (QID) | ORAL | 1 refills | Status: DC | PRN
Start: 2019-08-13 — End: 2019-12-10

## 2019-08-13 NOTE — Progress Notes (Signed)
Chart reviewed for nurse visit. Agree with plan of care. Will rx phenergan  Estill Dooms, NP 08/13/2019 4:37 PM

## 2019-08-13 NOTE — Addendum Note (Signed)
Addended by: Derrek Monaco A on: 08/13/2019 04:38 PM   Modules accepted: Orders

## 2019-08-13 NOTE — Progress Notes (Signed)
   NURSE VISIT- PREGNANCY CONFIRMATION   SUBJECTIVE:  Sherry [name] is a 39 y.o. [phone] female at [redacted]w[redacted]d by certain LMP of Patient's last menstrual period was 06/28/2019 (exact date). Here for pregnancy confirmation.  Home pregnancy test: five  She reports nausea and vomiting.  She is taking prenatal vitamins.    OBJECTIVE:  BP 124/77 (BP Location: Right Arm, Patient Position: Sitting, Cuff Size: Normal)   Pulse 100   Ht 5\' 3"  (1.6 m)   Wt 179 lb (81.2 kg)   LMP 06/28/2019 (Exact Date)   BMI 31.71 kg/m   Appears well, in no apparent distress OB History  Gravida Para Term Preterm AB Living  5 2 2   1 2   SAB TAB Ectopic Multiple Live Births  1       2    # Outcome Date GA Lbr Len/2nd Weight Sex Delivery Anes PTL Lv  5 Current           4 Term 09/19/11 [redacted]w[redacted]d  7 lb 0.2 oz (3.181 kg) F Vag-Vacuum EPI  LIV  3 Term 2006 [redacted]w[redacted]d  6 lb 12 oz (3.062 kg) M Vag-Spont EPI  LIV  2 SAB 2004          1 Gravida             No results found for this or any previous visit (from the past 24 hour(s)).  ASSESSMENT: Positive pregnancy test, [redacted]w[redacted]d by LMP    PLAN: Schedule for dating ultrasound in 2 weeks. Prenatal vitamins: continue   Nausea medicines: requested-note routed to Derrek Monaco, NP to send prescription   OB packet given: Yes  [name]  08/13/2019 3:19 PM

## 2019-09-01 ENCOUNTER — Telehealth: Payer: Self-pay | Admitting: Adult Health

## 2019-09-01 ENCOUNTER — Encounter: Payer: Self-pay | Admitting: *Deleted

## 2019-09-01 NOTE — Telephone Encounter (Signed)
Telephoned patient at home number. Patient states was seen at [facility] and was told to follow up with Dr. Durward Fortes patient since Ultrasound was done and blood work could cancel ultrasound appointment. Advised patient could schedule initial prenatal. Patient states having spotting and 2 previous miscarriages.  Patient was upset and wanted to have an appointment sooner. Transferred call to nursing supervisor.

## 2019-09-01 NOTE — Telephone Encounter (Signed)
Patient states wants a nurse to follow up with her regarding her hospital visit from last Thursday.

## 2019-09-03 ENCOUNTER — Other Ambulatory Visit: Payer: BC Managed Care – PPO

## 2019-09-08 ENCOUNTER — Telehealth: Payer: Self-pay | Admitting: Adult Health

## 2019-09-08 NOTE — Telephone Encounter (Signed)

## 2019-09-09 ENCOUNTER — Encounter: Payer: Self-pay | Admitting: Adult Health

## 2019-09-09 ENCOUNTER — Ambulatory Visit (INDEPENDENT_AMBULATORY_CARE_PROVIDER_SITE_OTHER): Payer: BC Managed Care – PPO | Admitting: Adult Health

## 2019-09-09 VITALS — BP 121/81 | HR 90 | Ht 63.0 in | Wt 178.0 lb

## 2019-09-09 DIAGNOSIS — O418X10 Other specified disorders of amniotic fluid and membranes, first trimester, not applicable or unspecified: Secondary | ICD-10-CM | POA: Diagnosis not present

## 2019-09-09 DIAGNOSIS — Z3A10 10 weeks gestation of pregnancy: Secondary | ICD-10-CM | POA: Insufficient documentation

## 2019-09-09 DIAGNOSIS — O468X1 Other antepartum hemorrhage, first trimester: Secondary | ICD-10-CM | POA: Diagnosis not present

## 2019-09-09 DIAGNOSIS — O208 Other hemorrhage in early pregnancy: Secondary | ICD-10-CM | POA: Insufficient documentation

## 2019-09-09 NOTE — Progress Notes (Addendum)
  Subjective:     Patient ID: Sherry [name], female   DOB: October 29, 1980, 39 y.o.   MRN: [id number]  HPI Sherry is a 39 year old black female, married, [id number] in for POC [place] for reassurance, worried over being told in ER at [facility] that has subchorionic hemorrhage. She is about 10+[redacted] weeks pregnant.  She is personal food shopper at [employer] No vaginal bleeding in 2 days Some cramping Reviewed past medical,surgical, social and family history. Reviewed medications and allergies.     Objective:   Physical Exam BP 121/81 (BP Location: Left Arm, Patient Position: Sitting, Cuff Size: Normal)   Pulse 90   Ht 5\' 3"  (1.6 m)   Wt 178 lb (80.7 kg)   LMP 06/28/2019 (Exact Date)   BMI 31.53 kg/m   Skin warm and dry Abdomen is soft and mildly tender POC US shows +FHM and +FM, has small [facility] noted    Assessment:     1. [redacted] weeks gestation of pregnancy Continue PNV  2. Subchorionic hemorrhage of placenta in first trimester, single or unspecified fetus No heavy lifting ir sex     Plan:     Keep [place] and new OB appt 09/17/19

## 2019-09-16 ENCOUNTER — Other Ambulatory Visit: Payer: Self-pay | Admitting: Obstetrics & Gynecology

## 2019-09-16 ENCOUNTER — Telehealth: Payer: Self-pay | Admitting: Advanced Practice Midwife

## 2019-09-16 DIAGNOSIS — Z3682 Encounter for antenatal screening for nuchal translucency: Secondary | ICD-10-CM

## 2019-09-16 NOTE — Telephone Encounter (Signed)

## 2019-09-17 ENCOUNTER — Encounter: Payer: Self-pay | Admitting: Advanced Practice Midwife

## 2019-09-17 ENCOUNTER — Ambulatory Visit (INDEPENDENT_AMBULATORY_CARE_PROVIDER_SITE_OTHER): Payer: BC Managed Care – PPO | Admitting: Advanced Practice Midwife

## 2019-09-17 ENCOUNTER — Ambulatory Visit (INDEPENDENT_AMBULATORY_CARE_PROVIDER_SITE_OTHER): Payer: BC Managed Care – PPO

## 2019-09-17 ENCOUNTER — Ambulatory Visit: Payer: BC Managed Care – PPO | Admitting: *Deleted

## 2019-09-17 VITALS — BP 108/72 | HR 97 | Wt 180.0 lb

## 2019-09-17 DIAGNOSIS — O3411 Maternal care for benign tumor of corpus uteri, first trimester: Secondary | ICD-10-CM

## 2019-09-17 DIAGNOSIS — O09529 Supervision of elderly multigravida, unspecified trimester: Secondary | ICD-10-CM | POA: Insufficient documentation

## 2019-09-17 DIAGNOSIS — Z3A11 11 weeks gestation of pregnancy: Secondary | ICD-10-CM | POA: Diagnosis not present

## 2019-09-17 DIAGNOSIS — Z3480 Encounter for supervision of other normal pregnancy, unspecified trimester: Secondary | ICD-10-CM

## 2019-09-17 DIAGNOSIS — O99331 Smoking (tobacco) complicating pregnancy, first trimester: Secondary | ICD-10-CM

## 2019-09-17 DIAGNOSIS — O09521 Supervision of elderly multigravida, first trimester: Secondary | ICD-10-CM | POA: Diagnosis not present

## 2019-09-17 DIAGNOSIS — F17200 Nicotine dependence, unspecified, uncomplicated: Secondary | ICD-10-CM

## 2019-09-17 DIAGNOSIS — Z3682 Encounter for antenatal screening for nuchal translucency: Secondary | ICD-10-CM | POA: Diagnosis not present

## 2019-09-17 DIAGNOSIS — D259 Leiomyoma of uterus, unspecified: Secondary | ICD-10-CM | POA: Diagnosis not present

## 2019-09-17 DIAGNOSIS — O0990 Supervision of high risk pregnancy, unspecified, unspecified trimester: Secondary | ICD-10-CM | POA: Insufficient documentation

## 2019-09-17 LAB — POCT URINALYSIS DIPSTICK OB
Glucose, UA: NEGATIVE
Ketones, UA: NEGATIVE
Leukocytes, UA: NEGATIVE
Nitrite, UA: NEGATIVE
POC,PROTEIN,UA: NEGATIVE

## 2019-09-17 MED ORDER — ASPIRIN 81 MG PO CHEW: 162.0000 mg | CHEWABLE_TABLET | Freq: Every day | ORAL | 8 refills | Status: DC

## 2019-09-17 NOTE — Progress Notes (Addendum)
[place] 11+4 wks,measurements c/w dates,crl 46.31 mm,NB present,NT 1 mm,normal right ovary,simple left corpus luteal cyst 2.9 x 2.7 x 2.7 cm,mult fibroids (#1) anterior 4.5 x 3.6 x 2.5 cm,(#2) posterior 2.4 x 1.6 x 2.4 cm,(#3) anterior 2.8 x 1.6 x 2.2 cm,fhr 147 bpm

## 2019-09-17 NOTE — Progress Notes (Signed)
INITIAL OBSTETRICAL VISIT Patient name: Sherry M Resnick MRN [id number]  Date of birth: 02/27/1981 Chief Complaint:   Initial Prenatal Visit (nt/it)  History of Present Illness:   Sherry [name] is a 39 y.o. [phone] African American female at [redacted]w[redacted]d by LMP c/w u/s at 7 weeks with an Estimated Date of Delivery: 04/03/20 being seen today for her initial obstetrical visit.   Her obstetrical history is significant for advanced maternal age.   Today she reports being anxious over the status of this pregnancy with her hx of an SAB in 2020 and an early u/s noting [facility] with this pregnancy.  Depression screen [facility] 2/9 09/17/2019 03/04/2019 02/18/2019  Decreased Interest 2 0 1  Down, Depressed, Hopeless 1 0 1  PHQ - 2 Score 3 0 2  Altered sleeping 0 - 3  Tired, decreased energy 3 - 3  Change in appetite 0 - 0  Feeling bad or failure about yourself  1 - 3  Trouble concentrating 0 - 2  Moving slowly or fidgety/restless 0 - 0  Suicidal thoughts 0 - 0  PHQ-9 Score 7 - 13    Patient's last menstrual period was 06/28/2019 (exact date). Last pap unsure, but declines today and would like at her next visit.  Review of Systems:   Pertinent items are noted in HPI Denies cramping/contractions, leakage of fluid, vaginal bleeding, abnormal vaginal discharge w/ itching/odor/irritation, headaches, visual changes, shortness of breath, chest pain, abdominal pain, severe nausea/vomiting, or problems with urination or bowel movements unless otherwise stated above.  Pertinent History Reviewed:  Reviewed past medical,surgical, social, obstetrical and family history.  Reviewed problem list, medications and allergies. OB History  Gravida Para Term Preterm AB Living  5 2 2   2 2   SAB TAB Ectopic Multiple Live Births  2       2    # Outcome Date GA Lbr Len/2nd Weight Sex Delivery Anes PTL Lv  5 Current           4 SAB 01/02/19 [redacted]w[redacted]d         3 Term 09/19/11 [redacted]w[redacted]d  7 lb 0.2 oz (3.181 kg) F Vag-Vacuum EPI  LIV  2 Term  08/12/04 [redacted]w[redacted]d  6 lb 12 oz (3.062 kg) M Vag-Spont EPI Y LIV  1 SAB 2004           Physical Assessment:   Vitals:   09/17/19 1458  BP: 108/72  Pulse: 97  Weight: 180 lb (81.6 kg)  Body mass index is 31.89 kg/m.       Physical Examination:  General appearance - well appearing, and in no distress  Mental status - alert, oriented to person, place, and time  Psych:  She has a normal mood and affect  Skin - warm and dry, normal color, no suspicious lesions noted  Chest - effort normal, all lung fields clear to auscultation bilaterally  Heart - normal rate and regular rhythm  Abdomen - soft, nontender  Extremities:  No swelling or varicosities noted  Pelvic - VULVA: normal appearing vulva with no masses, tenderness or lesions  VAGINA: normal appearing vagina with normal color and discharge, no lesions  CERVIX: normal appearing cervix without discharge or lesions, no CMT  Thin prep pap is not done   TODAY'S NT [place] 11+4 wks,measurements c/w dates,crl 46.31 mm,NB present,NT 1 mm,normal right ovary,simple left corpus luteal cyst 2.9 x 2.7 x 2.7 cm,mult fibroids (#1) anterior 4.5 x 3.6 x 2.5 cm,(#2) posterior 2.4 x 1.6 x  2.4 cm,(#3) anterior 2.8 x 1.6 x 2.2 cm,fhr 147 bpm  Results for orders placed or performed in visit on 09/17/19 (from the past 24 hour(s))  POC Urinalysis Dipstick OB   Collection Time: 09/17/19  3:25 PM  Result Value Ref Range   Color, UA     Clarity, UA     Glucose, UA Negative Negative   Bilirubin, UA     Ketones, UA neg    Spec Grav, UA     Blood, UA trace    pH, UA     POC,PROTEIN,UA Negative Negative, Trace, Small (1+), Moderate (2+), Large (3+), 4+   Urobilinogen, UA     Nitrite, UA neg    Leukocytes, UA Negative Negative   Appearance     Odor      Assessment & Plan:  1) Low-Risk Pregnancy [id number] at [redacted]w[redacted]d with an Estimated Date of Delivery: 04/03/20   2) Initial OB visit  3) AMA, age <9yrs; bASA 162mg /d recommended  4) Smoker, 1/4 ppd- rec to  quit  5) Multiple fibroids, #3 ranging from 2-4.5cm in size  5) Anxiety/depression, no meds currently  Meds:  Meds ordered this encounter  Medications  . aspirin 81 MG chewable tablet    Sig: Chew 2 tablets (162 mg total) by mouth daily.    Dispense:  60 tablet    Refill:  8    Order Specific Question:   Supervising Provider    Answer:   Jonnie Kind [phone]    Initial labs obtained Continue prenatal vitamins Reviewed n/v relief measures and warning s/s to report Reviewed recommended weight gain based on pre-gravid BMI Encouraged well-balanced diet Genetic & carrier screening discussed: requests Panorama and NT/IT, declines Horizon 14  Ultrasound discussed; fetal survey: requested CCNC completed> form faxed if has or is planning to apply for medicaid The nature of [place] for [employer] with multiple MDs and other Advanced Practice Providers was explained to patient; also emphasized that fellows, residents, and students are part of our team. Ordered home bp cuff. Check bp weekly, let us know if >140/90.   Indications for ASA therapy (per uptodate) OR Two or more of the following: Obesity (BMI>30 kg/m2) Yes Age ?35 years Yes Sociodemographic characteristics (African American race, low socioeconomic level) Yes  Indications for early A1C (per uptodate) BMI >=25 (>=23 in Asian women) AND one of the following High-risk race/ethnicity (eg, African American, Latino, Native American, [place] American, [place]) Yes   Follow-up: Return in about 4 weeks (around 10/15/2019) for LROB, 2nd IT, in person.   Orders Placed This Encounter  Procedures  . Urine Culture  . GC/Chlamydia Probe Amp  . Integrated 1  . Genetic Screening  . Pain Management Screening Profile (10S)  . CBC/D/Plt+RPR+Rh+ABO+Rub Ab...  . HgB A1c  . POC Urinalysis Dipstick OB    Myrtis Ser [facility] 09/17/2019 11:48 PM

## 2019-09-17 NOTE — Patient Instructions (Signed)
Sherry [name], I greatly value your feedback.  If you receive a survey following your visit with [place] today, we appreciate you taking the time to fill it out.  Thanks, [name], [facility] ([place], [place] [id number]) Entrance C, located off of [address]   Nausea & Vomiting  Have saltine crackers or pretzels by your bed and eat a few bites before you raise your head out of bed in the morning  Eat small frequent meals throughout the day instead of large meals  Drink plenty of fluids throughout the day to stay hydrated, just don't drink a lot of fluids with your meals.  This can make your stomach fill up faster making you feel sick  Do not brush your teeth right after you eat  Products with real ginger are good for nausea, like ginger ale and ginger hard candy Make sure it says made with real ginger!  Sucking on sour candy like lemon heads is also good for nausea  If your prenatal vitamins make you nauseated, take them at night so you will sleep through the nausea  Sea Bands  If you feel like you need medicine for the nausea & vomiting please let us know  If you are unable to keep any fluids or food down please let us know   Constipation  Drink plenty of fluid, preferably water, throughout the day  Eat foods high in fiber such as fruits, vegetables, and grains  Exercise, such as walking, is a good way to keep your bowels regular  Drink warm fluids, especially warm prune juice, or decaf coffee  Eat a 1/2 cup of real oatmeal (not instant), 1/2 cup applesauce, and 1/2-1 cup warm prune juice every day  If needed, you may take Colace (docusate sodium) stool softener once or twice a day to help keep the stool soft.   If you still are having problems with constipation, you may take Miralax once daily as needed to help keep your bowels regular.   Home Blood Pressure Monitoring for Patients   Your provider  has recommended that you check your blood pressure (BP) at least once a week at home. If you do not have a blood pressure cuff at home, one will be provided for you. Contact your provider if you have not received your monitor within 1 week.   Helpful Tips for Accurate Home Blood Pressure Checks  . Don't smoke, exercise, or drink caffeine 30 minutes before checking your BP . Use the restroom before checking your BP (a full bladder can raise your pressure) . Relax in a comfortable upright chair . Feet on the ground . Left arm resting comfortably on a flat surface at the level of your heart . Legs uncrossed . Back supported . Sit quietly and don't talk . Place the cuff on your bare arm . Adjust snuggly, so that only two fingertips can fit between your skin and the top of the cuff . Check 2 readings separated by at least one minute . Keep a log of your BP readings . For a visual, please reference this diagram: http://ccnc.care/bpdiagram  Provider Name: [facility]     Phone: [phone]  Zone 1: ALL CLEAR  Continue to monitor your symptoms:  . BP reading is less than 140 (top number) or less than 90 (bottom number)  . No right upper stomach pain . No headaches or seeing  spots . No feeling nauseated or throwing up . No swelling in face and hands  Zone 2: CAUTION Call your doctor's office for any of the following:  . BP reading is greater than 140 (top number) or greater than 90 (bottom number)  . Stomach pain under your ribs in the middle or right side . Headaches or seeing spots . Feeling nauseated or throwing up . Swelling in face and hands  Zone 3: EMERGENCY  Seek immediate medical care if you have any of the following:  . BP reading is greater than160 (top number) or greater than 110 (bottom number) . Severe headaches not improving with Tylenol . Serious difficulty catching your breath . Any worsening symptoms from Zone 2    First Trimester of Pregnancy The first  trimester of pregnancy is from week 1 until the end of week 12 (months 1 through 3). A week after a sperm fertilizes an egg, the egg will implant on the wall of the uterus. This embryo will begin to develop into a baby. Genes from you and your partner are forming the baby. The female genes determine whether the baby is a boy or a girl. At 6-8 weeks, the eyes and face are formed, and the heartbeat can be seen on ultrasound. At the end of 12 weeks, all the baby's organs are formed.  Now that you are pregnant, you will want to do everything you can to have a healthy baby. Two of the most important things are to get good prenatal care and to follow your health care provider's instructions. Prenatal care is all the medical care you receive before the baby's birth. This care will help prevent, find, and treat any problems during the pregnancy and childbirth. BODY CHANGES Your body goes through many changes during pregnancy. The changes vary from woman to woman.   You may gain or lose a couple of pounds at first.  You may feel sick to your stomach (nauseous) and throw up (vomit). If the vomiting is uncontrollable, call your health care provider.  You may tire easily.  You may develop headaches that can be relieved by medicines approved by your health care provider.  You may urinate more often. Painful urination may mean you have a bladder infection.  You may develop heartburn as a result of your pregnancy.  You may develop constipation because certain hormones are causing the muscles that push waste through your intestines to slow down.  You may develop hemorrhoids or swollen, bulging veins (varicose veins).  Your breasts may begin to grow larger and become tender. Your nipples may stick out more, and the tissue that surrounds them (areola) may become darker.  Your gums may bleed and may be sensitive to brushing and flossing.  Dark spots or blotches (chloasma, mask of pregnancy) may develop on your  face. This will likely fade after the baby is born.  Your menstrual periods will stop.  You may have a loss of appetite.  You may develop cravings for certain kinds of food.  You may have changes in your emotions from day to day, such as being excited to be pregnant or being concerned that something may go wrong with the pregnancy and baby.  You may have more vivid and strange dreams.  You may have changes in your hair. These can include thickening of your hair, rapid growth, and changes in texture. Some women also have hair loss during or after pregnancy, or hair that feels dry or thin. Your hair  will most likely return to normal after your baby is born. WHAT TO EXPECT AT YOUR PRENATAL VISITS During a routine prenatal visit:  You will be weighed to make sure you and the baby are growing normally.  Your blood pressure will be taken.  Your abdomen will be measured to track your baby's growth.  The fetal heartbeat will be listened to starting around week 10 or 12 of your pregnancy.  Test results from any previous visits will be discussed. Your health care provider may ask you:  How you are feeling.  If you are feeling the baby move.  If you have had any abnormal symptoms, such as leaking fluid, bleeding, severe headaches, or abdominal cramping.  If you have any questions. Other tests that may be performed during your first trimester include:  Blood tests to find your blood type and to check for the presence of any previous infections. They will also be used to check for low iron levels (anemia) and Rh antibodies. Later in the pregnancy, blood tests for diabetes will be done along with other tests if problems develop.  Urine tests to check for infections, diabetes, or protein in the urine.  An ultrasound to confirm the proper growth and development of the baby.  An amniocentesis to check for possible genetic problems.  Fetal screens for spina bifida and Down syndrome.  You  may need other tests to make sure you and the baby are doing well. HOME CARE INSTRUCTIONS  Medicines  Follow your health care provider's instructions regarding medicine use. Specific medicines may be either safe or unsafe to take during pregnancy.  Take your prenatal vitamins as directed.  If you develop constipation, try taking a stool softener if your health care provider approves. Diet  Eat regular, well-balanced meals. Choose a variety of foods, such as meat or vegetable-based protein, fish, milk and low-fat dairy products, vegetables, fruits, and whole grain breads and cereals. Your health care provider will help you determine the amount of weight gain that is right for you.  Avoid raw meat and uncooked cheese. These carry germs that can cause birth defects in the baby.  Eating four or five small meals rather than three large meals a day may help relieve nausea and vomiting. If you start to feel nauseous, eating a few soda crackers can be helpful. Drinking liquids between meals instead of during meals also seems to help nausea and vomiting.  If you develop constipation, eat more high-fiber foods, such as fresh vegetables or fruit and whole grains. Drink enough fluids to keep your urine clear or pale yellow. Activity and Exercise  Exercise only as directed by your health care provider. Exercising will help you:  Control your weight.  Stay in shape.  Be prepared for labor and delivery.  Experiencing pain or cramping in the lower abdomen or low back is a good sign that you should stop exercising. Check with your health care provider before continuing normal exercises.  Try to avoid standing for long periods of time. Move your legs often if you must stand in one place for a long time.  Avoid heavy lifting.  Wear low-heeled shoes, and practice good posture.  You may continue to have sex unless your health care provider directs you otherwise. Relief of Pain or Discomfort  Wear a  good support bra for breast tenderness.    Take warm sitz baths to soothe any pain or discomfort caused by hemorrhoids. Use hemorrhoid cream if your health care provider approves.  Rest with your legs elevated if you have leg cramps or low back pain.  If you develop varicose veins in your legs, wear support hose. Elevate your feet for 15 minutes, 3-4 times a day. Limit salt in your diet. Prenatal Care  Schedule your prenatal visits by the twelfth week of pregnancy. They are usually scheduled monthly at first, then more often in the last 2 months before delivery.  Write down your questions. Take them to your prenatal visits.  Keep all your prenatal visits as directed by your health care provider. Safety  Wear your seat belt at all times when driving.  Make a list of emergency phone numbers, including numbers for family, friends, the hospital, and police and fire departments. General Tips  Ask your health care provider for a referral to a local prenatal education class. Begin classes no later than at the beginning of month 6 of your pregnancy.  Ask for help if you have counseling or nutritional needs during pregnancy. Your health care provider can offer advice or refer you to specialists for help with various needs.  Do not use hot tubs, steam rooms, or saunas.  Do not douche or use tampons or scented sanitary pads.  Do not cross your legs for long periods of time.  Avoid cat litter boxes and soil used by cats. These carry germs that can cause birth defects in the baby and possibly loss of the fetus by miscarriage or stillbirth.  Avoid all smoking, herbs, alcohol, and medicines not prescribed by your health care provider. Chemicals in these affect the formation and growth of the baby.  Schedule a dentist appointment. At home, brush your teeth with a soft toothbrush and be gentle when you floss. SEEK MEDICAL CARE IF:   You have dizziness.  You have mild pelvic cramps, pelvic  pressure, or nagging pain in the abdominal area.  You have persistent nausea, vomiting, or diarrhea.  You have a bad smelling vaginal discharge.  You have pain with urination.  You notice increased swelling in your face, hands, legs, or ankles. SEEK IMMEDIATE MEDICAL CARE IF:   You have a fever.  You are leaking fluid from your vagina.  You have spotting or bleeding from your vagina.  You have severe abdominal cramping or pain.  You have rapid weight gain or loss.  You vomit blood or material that looks like coffee grounds.  You are exposed to [place] measles and have never had them.  You are exposed to fifth disease or chickenpox.  You develop a severe headache.  You have shortness of breath.  You have any kind of trauma, such as from a fall or a car accident. Document Released: 02/28/2001 Document Revised: 07/21/2013 Document Reviewed: 01/14/2013 [facility] Patient Information 2015 [place], [place]. This information is not intended to replace advice given to you by your health care provider. Make sure you discuss any questions you have with your health care provider.

## 2019-09-18 LAB — HEMOGLOBIN A1C
Est. average glucose Bld gHb Est-mCnc: 105 mg/dL
Hgb A1c MFr Bld: 5.3 % (ref 4.8–5.6)

## 2019-09-19 LAB — CBC/D/PLT+RPR+RH+ABO+RUB AB...
Antibody Screen: NEGATIVE
Basophils Absolute: 0 10*3/uL (ref 0.0–0.2)
Basos: 0 %
EOS (ABSOLUTE): 0.2 10*3/uL (ref 0.0–0.4)
Eos: 2 %
HCV Ab: <0.1 s/co ratio (ref 0.0–0.9)
HIV Screen 4th Generation wRfx: NONREACTIVE
Hematocrit: 38.9 % (ref 34.0–46.6)
Hemoglobin: 12.8 g/dL (ref 11.1–15.9)
Hepatitis B Surface Ag: NEGATIVE
Immature Grans (Abs): 0 10*3/uL (ref 0.0–0.1)
Immature Granulocytes: 0 %
Lymphocytes Absolute: 3 10*3/uL (ref 0.7–3.1)
Lymphs: 37 %
MCH: 30.7 pg (ref 26.6–33.0)
MCHC: 32.9 g/dL (ref 31.5–35.7)
MCV: 93 fL (ref 79–97)
Monocytes Absolute: 0.7 10*3/uL (ref 0.1–0.9)
Monocytes: 9 %
Neutrophils Absolute: 4.3 10*3/uL (ref 1.4–7.0)
Neutrophils: 52 %
Platelets: 220 10*3/uL (ref 150–450)
RBC: 4.17 x10E6/uL (ref 3.77–5.28)
RDW: 12.7 % (ref 11.7–15.4)
RPR Ser Ql: NONREACTIVE
Rh Factor: POSITIVE
Rubella Antibodies, IGG: 5.4 index (ref >0.99)
WBC: 8.3 10*3/uL (ref 3.4–10.8)

## 2019-09-19 LAB — INTEGRATED 1
Crown Rump Length: 46.3 mm
Gest. Age on Collection Date: 11.3 weeks
Maternal Age at EDD: 38.2 yr
Nuchal Translucency (NT): 1 mm
Number of Fetuses: 1
PAPP-A Value: 1067.9 ng/mL
Weight: 180 [lb_av]

## 2019-09-19 LAB — HCV INTERPRETATION

## 2019-09-20 LAB — PMP SCREEN PROFILE (10S), URINE
Amphetamine Scrn, Ur: NEGATIVE ng/mL
BARBITURATE SCREEN URINE: NEGATIVE ng/mL
BENZODIAZEPINE SCREEN, URINE: NEGATIVE ng/mL
CANNABINOIDS UR QL SCN: POSITIVE ng/mL — AB
Cocaine (Metab) Scrn, Ur: NEGATIVE ng/mL
Creatinine(Crt), U: 169.5 mg/dL (ref 20.0–300.0)
Methadone Screen, Urine: NEGATIVE ng/mL
OXYCODONE+OXYMORPHONE UR QL SCN: NEGATIVE ng/mL
Opiate Scrn, Ur: NEGATIVE ng/mL
Ph of Urine: 6 (ref 4.5–8.9)
Phencyclidine Qn, Ur: NEGATIVE ng/mL
Propoxyphene Scrn, Ur: NEGATIVE ng/mL

## 2019-09-20 LAB — URINE CULTURE

## 2019-09-20 LAB — SPECIMEN STATUS REPORT

## 2019-09-20 LAB — GC/CHLAMYDIA PROBE AMP
Chlamydia trachomatis, NAA: NEGATIVE
Neisseria Gonorrhoeae by PCR: NEGATIVE

## 2019-09-23 ENCOUNTER — Other Ambulatory Visit: Payer: Self-pay | Admitting: Advanced Practice Midwife

## 2019-09-23 ENCOUNTER — Encounter: Payer: Self-pay | Admitting: Advanced Practice Midwife

## 2019-09-23 DIAGNOSIS — R8271 Bacteriuria: Secondary | ICD-10-CM | POA: Insufficient documentation

## 2019-09-23 DIAGNOSIS — F1290 Cannabis use, unspecified, uncomplicated: Secondary | ICD-10-CM | POA: Insufficient documentation

## 2019-09-23 MED ORDER — NITROFURANTOIN MONOHYD MACRO 100 MG PO CAPS
100.0000 mg | ORAL_CAPSULE | Freq: Two times a day (BID) | ORAL | 0 refills | Status: DC
Start: 2019-09-23 — End: 2019-10-13

## 2019-10-10 ENCOUNTER — Encounter: Payer: Self-pay | Admitting: *Deleted

## 2019-10-10 DIAGNOSIS — Z3480 Encounter for supervision of other normal pregnancy, unspecified trimester: Secondary | ICD-10-CM

## 2019-10-13 ENCOUNTER — Encounter: Payer: Self-pay | Admitting: Advanced Practice Midwife

## 2019-10-13 ENCOUNTER — Other Ambulatory Visit (HOSPITAL_COMMUNITY)
Admission: RE | Admit: 2019-10-13 | Discharge: 2019-10-13 | Disposition: A | Discharge status: Home / Self Care | Payer: BC Managed Care – PPO | Source: Ambulatory Visit | Attending: Advanced Practice Midwife | Admitting: Advanced Practice Midwife

## 2019-10-13 ENCOUNTER — Ambulatory Visit (INDEPENDENT_AMBULATORY_CARE_PROVIDER_SITE_OTHER): Payer: BC Managed Care – PPO | Admitting: Advanced Practice Midwife

## 2019-10-13 VITALS — BP 121/81 | HR 106 | Wt 184.0 lb

## 2019-10-13 DIAGNOSIS — Z3480 Encounter for supervision of other normal pregnancy, unspecified trimester: Secondary | ICD-10-CM

## 2019-10-13 DIAGNOSIS — O99322 Drug use complicating pregnancy, second trimester: Secondary | ICD-10-CM

## 2019-10-13 DIAGNOSIS — Z3481 Encounter for supervision of other normal pregnancy, first trimester: Secondary | ICD-10-CM | POA: Diagnosis not present

## 2019-10-13 DIAGNOSIS — F1290 Cannabis use, unspecified, uncomplicated: Secondary | ICD-10-CM

## 2019-10-13 DIAGNOSIS — Z3A15 15 weeks gestation of pregnancy: Secondary | ICD-10-CM

## 2019-10-13 DIAGNOSIS — Z1389 Encounter for screening for other disorder: Secondary | ICD-10-CM

## 2019-10-13 DIAGNOSIS — R892 Abnormal level of other drugs, medicaments and biological substances in specimens from other organs, systems and tissues: Secondary | ICD-10-CM

## 2019-10-13 DIAGNOSIS — Z87440 Personal history of urinary (tract) infections: Secondary | ICD-10-CM

## 2019-10-13 DIAGNOSIS — Z331 Pregnant state, incidental: Secondary | ICD-10-CM

## 2019-10-13 LAB — POCT URINALYSIS DIPSTICK OB
Glucose, UA: NEGATIVE
Ketones, UA: NEGATIVE
Leukocytes, UA: NEGATIVE
Nitrite, UA: NEGATIVE
POC,PROTEIN,UA: NEGATIVE

## 2019-10-13 NOTE — Patient Instructions (Signed)
Sherry [name], I greatly value your feedback.  If you receive a survey following your visit with [place] today, we appreciate you taking the time to fill it out.  Thanks, [name] CNM  [place] [facility] ([place], [place] [address]) Entrance C, located off of [place] parking  Go to [email or web address] to register for FREE online childbirth classes  [place] Pediatricians/Family Doctors:  [facility] [place] [phone]                 [place] [phone] (usually not accepting new patients unless you have family there already, you are always welcome to call and ask)       [facility] Department [phone]       [facility] Pediatricians/Family Doctors:   [facility]: [phone]  [facility]: [phone]  [facility]: [place] Doctors:   [facility]: [facility]: [place]:  [place]: [phone]    Home Blood Pressure Monitoring for Patients   Your provider has recommended that you check your blood pressure (BP) at least once a week at home. If you do not have a blood pressure cuff at home, one will be provided for you. Contact your provider if you have not received your monitor within 1 week.   Helpful Tips for Accurate Home Blood Pressure Checks  . Don't smoke, exercise, or drink caffeine 30 minutes before checking your BP . Use the restroom before checking your BP (a full bladder can raise your pressure) . Relax in a comfortable upright chair . Feet on the ground . Left arm resting comfortably on a flat surface at the level of your heart . Legs uncrossed . Back supported . Sit quietly and don't talk . Place the cuff on your bare arm . Adjust snuggly, so that only two  fingertips can fit between your skin and the top of the cuff . Check 2 readings separated by at least one minute . Keep a log of your BP readings . For a visual, please reference this diagram: http://ccnc.care/bpdiagram  Provider Name: [facility]     Phone: [phone]  Zone 1: ALL CLEAR  Continue to monitor your symptoms:  . BP reading is less than 140 (top number) or less than 90 (bottom number)  . No right upper stomach pain . No headaches or seeing spots . No feeling nauseated or throwing up . No swelling in face and hands  Zone 2: CAUTION Call your doctor's office for any of the following:  . BP reading is greater than 140 (top number) or greater than 90 (bottom number)  . Stomach pain under your ribs in the middle or right side . Headaches or seeing spots . Feeling nauseated or throwing up . Swelling in face and hands  Zone 3: EMERGENCY  Seek immediate medical care if you have any of the following:  . BP reading is greater than160 (top number) or greater than 110 (bottom number) . Severe headaches not improving with Tylenol . Serious difficulty catching your breath . Any worsening symptoms from Zone 2     Second Trimester of Pregnancy The second trimester is from week 14 through week 27 (months 4 through 6). The second trimester is often a time when you feel your best.  Your body has adjusted to being pregnant, and you begin to feel better physically. Usually, morning sickness has lessened or quit completely, you may have more energy, and you may have an increase in appetite. The second trimester is also a time when the fetus is growing rapidly. At the end of the sixth month, the fetus is about 9 inches long and weighs about 1 pounds. You will likely begin to feel the baby move (quickening) between 16 and 20 weeks of pregnancy. Body changes during your second trimester Your body continues to go through many changes during your second trimester. The changes vary from  woman to woman.  Your weight will continue to increase. You will notice your lower abdomen bulging out.  You may begin to get stretch marks on your hips, abdomen, and breasts.  You may develop headaches that can be relieved by medicines. The medicines should be approved by your health care provider.  You may urinate more often because the fetus is pressing on your bladder.  You may develop or continue to have heartburn as a result of your pregnancy.  You may develop constipation because certain hormones are causing the muscles that push waste through your intestines to slow down.  You may develop hemorrhoids or swollen, bulging veins (varicose veins).  You may have back pain. This is caused by: ? Weight gain. ? Pregnancy hormones that are relaxing the joints in your pelvis. ? A shift in weight and the muscles that support your balance.  Your breasts will continue to grow and they will continue to become tender.  Your gums may bleed and may be sensitive to brushing and flossing.  Dark spots or blotches (chloasma, mask of pregnancy) may develop on your face. This will likely fade after the baby is born.  A dark line from your belly button to the pubic area (linea nigra) may appear. This will likely fade after the baby is born.  You may have changes in your hair. These can include thickening of your hair, rapid growth, and changes in texture. Some women also have hair loss during or after pregnancy, or hair that feels dry or thin. Your hair will most likely return to normal after your baby is born.  What to expect at prenatal visits During a routine prenatal visit:  You will be weighed to make sure you and the fetus are growing normally.  Your blood pressure will be taken.  Your abdomen will be measured to track your baby's growth.  The fetal heartbeat will be listened to.  Any test results from the previous visit will be discussed.  Your health care provider may ask  you:  How you are feeling.  If you are feeling the baby move.  If you have had any abnormal symptoms, such as leaking fluid, bleeding, severe headaches, or abdominal cramping.  If you are using any tobacco products, including cigarettes, chewing tobacco, and electronic cigarettes.  If you have any questions.  Other tests that may be performed during your second trimester include:  Blood tests that check for: ? Low iron levels (anemia). ? High blood sugar that affects pregnant women (gestational diabetes) between 38 and 28 weeks. ? Rh antibodies. This is to check for a protein on red blood cells (Rh factor).  Urine tests to check for infections, diabetes, or protein in the urine.  An ultrasound to confirm the proper growth and development of the baby.  An amniocentesis to check for possible genetic problems.  Fetal screens  for spina bifida and Down syndrome.  HIV (human immunodeficiency virus) testing. Routine prenatal testing includes screening for HIV, unless you choose not to have this test.  Follow these instructions at home: Medicines  Follow your health care provider's instructions regarding medicine use. Specific medicines may be either safe or unsafe to take during pregnancy.  Take a prenatal vitamin that contains at least 600 micrograms (mcg) of folic acid.  If you develop constipation, try taking a stool softener if your health care provider approves. Eating and drinking  Eat a balanced diet that includes fresh fruits and vegetables, whole grains, good sources of protein such as meat, eggs, or tofu, and low-fat dairy. Your health care provider will help you determine the amount of weight gain that is right for you.  Avoid raw meat and uncooked cheese. These carry germs that can cause birth defects in the baby.  If you have low calcium intake from food, talk to your health care provider about whether you should take a daily calcium supplement.  Limit foods that  are high in fat and processed sugars, such as fried and sweet foods.  To prevent constipation: ? Drink enough fluid to keep your urine clear or pale yellow. ? Eat foods that are high in fiber, such as fresh fruits and vegetables, whole grains, and beans. Activity  Exercise only as directed by your health care provider. Most women can continue their usual exercise routine during pregnancy. Try to exercise for 30 minutes at least 5 days a week. Stop exercising if you experience uterine contractions.  Avoid heavy lifting, wear low heel shoes, and practice good posture.  A sexual relationship may be continued unless your health care provider directs you otherwise. Relieving pain and discomfort  Wear a good support bra to prevent discomfort from breast tenderness.  Take warm sitz baths to soothe any pain or discomfort caused by hemorrhoids. Use hemorrhoid cream if your health care provider approves.  Rest with your legs elevated if you have leg cramps or low back pain.  If you develop varicose veins, wear support hose. Elevate your feet for 15 minutes, 3-4 times a day. Limit salt in your diet. Prenatal Care  Write down your questions. Take them to your prenatal visits.  Keep all your prenatal visits as told by your health care provider. This is important. Safety  Wear your seat belt at all times when driving.  Make a list of emergency phone numbers, including numbers for family, friends, the hospital, and police and fire departments. General instructions  Ask your health care provider for a referral to a local prenatal education class. Begin classes no later than the beginning of month 6 of your pregnancy.  Ask for help if you have counseling or nutritional needs during pregnancy. Your health care provider can offer advice or refer you to specialists for help with various needs.  Do not use hot tubs, steam rooms, or saunas.  Do not douche or use tampons or scented sanitary  pads.  Do not cross your legs for long periods of time.  Avoid cat litter boxes and soil used by cats. These carry germs that can cause birth defects in the baby and possibly loss of the fetus by miscarriage or stillbirth.  Avoid all smoking, herbs, alcohol, and unprescribed drugs. Chemicals in these products can affect the formation and growth of the baby.  Do not use any products that contain nicotine or tobacco, such as cigarettes and e-cigarettes. If you need help quitting,  ask your health care provider.  Visit your dentist if you have not gone yet during your pregnancy. Use a soft toothbrush to brush your teeth and be gentle when you floss. Contact a health care provider if:  You have dizziness.  You have mild pelvic cramps, pelvic pressure, or nagging pain in the abdominal area.  You have persistent nausea, vomiting, or diarrhea.  You have a bad smelling vaginal discharge.  You have pain when you urinate. Get help right away if:  You have a fever.  You are leaking fluid from your vagina.  You have spotting or bleeding from your vagina.  You have severe abdominal cramping or pain.  You have rapid weight gain or weight loss.  You have shortness of breath with chest pain.  You notice sudden or extreme swelling of your face, hands, ankles, feet, or legs.  You have not felt your baby move in over an hour.  You have severe headaches that do not go away when you take medicine.  You have vision changes. Summary  The second trimester is from week 14 through week 27 (months 4 through 6). It is also a time when the fetus is growing rapidly.  Your body goes through many changes during pregnancy. The changes vary from woman to woman.  Avoid all smoking, herbs, alcohol, and unprescribed drugs. These chemicals affect the formation and growth your baby.  Do not use any tobacco products, such as cigarettes, chewing tobacco, and e-cigarettes. If you need help quitting, ask your  health care provider.  Contact your health care provider if you have any questions. Keep all prenatal visits as told by your health care provider. This is important. This information is not intended to replace advice given to you by your health care provider. Make sure you discuss any questions you have with your health care provider. Document Released: 02/28/2001 Document Revised: 08/12/2015 Document Reviewed: 05/07/2012 Elsevier Interactive Patient Education  2017 [place] FLU! Because you are pregnant, we at [facility], along with the Centers for Disease Control (CDC), recommend that you receive the flu vaccine to protect yourself and your baby from the flu. The flu is more likely to cause severe illness in pregnant women than in women of reproductive age who are not pregnant. Changes in the immune system, heart, and lungs during pregnancy make pregnant women (and women up to two weeks postpartum) more prone to severe illness from flu, including illness resulting in hospitalization. Flu also may be harmful for a pregnant woman's developing baby. A common flu symptom is fever, which may be associated with neural tube defects and other adverse outcomes for a developing baby. Getting vaccinated can also help protect a baby after birth from flu. (Mom passes antibodies onto the developing baby during her pregnancy.)  A Flu Vaccine is the Best Protection Against Flu Getting a flu vaccine is the first and most important step in protecting against flu. Pregnant women should get a flu shot and not the live attenuated influenza vaccine (LAIV), also known as nasal spray flu vaccine. Flu vaccines given during pregnancy help protect both the mother and her baby from flu. Vaccination has been shown to reduce the risk of flu-associated acute respiratory infection in pregnant women by up to one-half. A 2018 study showed that getting a flu shot reduced a pregnant woman's  risk of being hospitalized with flu by an average of 40 percent. Pregnant women who get a flu  vaccine are also helping to protect their babies from flu illness for the first several months after their birth, when they are too young to get vaccinated.   A Long Record of Safety for Flu Shots in Pregnant Women Flu shots have been given to millions of pregnant women over many years with a good safety record. There is a lot of evidence that flu vaccines can be given safely during pregnancy; though these data are limited for the first trimester. The CDC recommends that pregnant women get vaccinated during any trimester of their pregnancy. It is very important for pregnant women to get the flu shot.   Other Preventive Actions In addition to getting a flu shot, pregnant women should take the same everyday preventive actions the CDC recommends of everyone, including covering coughs, washing hands often, and avoiding people who are sick.  Symptoms and Treatment If you get sick with flu symptoms call your doctor right away. There are antiviral drugs that can treat flu illness and prevent serious flu complications. The CDC recommends prompt treatment for people who have influenza infection or suspected influenza infection and who are at high risk of serious flu complications, such as people with asthma, diabetes (including gestational diabetes), or heart disease. Early treatment of influenza in hospitalized pregnant women has been shown to reduce the length of the hospital stay.  Symptoms Flu symptoms include fever, cough, sore throat, runny or stuffy nose, body aches, headache, chills and fatigue. Some people may also have vomiting and diarrhea. People may be infected with the flu and have respiratory symptoms without a fever.  Early Treatment is Important for Pregnant Women Treatment should begin as soon as possible because antiviral drugs work best when started early (within 48 hours after symptoms  start). Antiviral drugs can make your flu illness milder and make you feel better faster. They may also prevent serious health problems that can result from flu illness. Oral oseltamivir (Tamiflu) is the preferred treatment for pregnant women because it has the most studies available to suggest that it is safe and beneficial. Antiviral drugs require a prescription from your provider. Having a fever caused by flu infection or other infections early in pregnancy may be linked to birth defects in a baby. In addition to taking antiviral drugs, pregnant women who get a fever should treat their fever with Tylenol (acetaminophen) and contact their provider immediately.  When to [place] If you are pregnant and have any of these signs, seek care immediately:  Difficulty breathing or shortness of breath  Pain or pressure in the chest or abdomen  Sudden dizziness  Confusion  Severe or persistent vomiting  High fever that is not responding to Tylenol (or store brand equivalent)  Decreased or no movement of your baby  [email or web address]

## 2019-10-13 NOTE — Progress Notes (Signed)
   LOW-RISK PREGNANCY VISIT Patient name: Sherry [name] MRN [id number]  Date of birth: 1980/04/20 Chief Complaint:   Routine Prenatal Visit (Pap today)  History of Present Illness:   Sherry [name] is a 39 y.o. [id number] female at [redacted]w[redacted]d with an Estimated Date of Delivery: 04/03/20 being seen today for ongoing management of a low-risk pregnancy.  Today she reports concern re +THC- has stopped smoking w/ preg; occ has lower abd discomfort that resolves with rest; has cut down to 1 cup coffee per day. Contractions: Not present. Vag. Bleeding: None.  Movement: Present. denies leaking of fluid. Review of Systems:   Pertinent items are noted in HPI Denies abnormal vaginal discharge w/ itching/odor/irritation, headaches, visual changes, shortness of breath, chest pain, abdominal pain, severe nausea/vomiting, or problems with urination or bowel movements unless otherwise stated above. Pertinent History Reviewed:  Reviewed past medical,surgical, social, obstetrical and family history.  Reviewed problem list, medications and allergies. Physical Assessment:   Vitals:   10/13/19 1053  BP: 121/81  Pulse: (!) 106  Weight: 184 lb (83.5 kg)  Body mass index is 32.59 kg/m.        Physical Examination:   General appearance: Well appearing, and in no distress  Mental status: Alert, oriented to person, place, and time  Skin: Warm & dry  Cardiovascular: Normal heart rate noted  Respiratory: Normal respiratory effort, no distress  Abdomen: Soft, gravid, nontender  Pelvic: Cervical exam deferred         Extremities: Edema: None  Fetal Status: Fetal Heart Rate (bpm): 152   Movement: Present    Results for orders placed or performed in visit on 10/13/19 (from the past 24 hour(s))  POC Urinalysis Dipstick OB   Collection Time: 10/13/19 10:54 AM  Result Value Ref Range   Color, UA     Clarity, UA     Glucose, UA Negative Negative   Bilirubin, UA     Ketones, UA neg    Spec Grav, UA     Blood, UA trace      pH, UA     POC,PROTEIN,UA Negative Negative, Trace, Small (1+), Moderate (2+), Large (3+), 4+   Urobilinogen, UA     Nitrite, UA neg    Leukocytes, UA Negative Negative   Appearance     Odor      Assessment & Plan:  1) Low-risk pregnancy [id number] at [redacted]w[redacted]d with an Estimated Date of Delivery: 04/03/20   2) ASB, completed meds, will get POC today  3) +THC and now has stopped, repeat UDS today   Meds: No orders of the defined types were placed in this encounter.  Labs/procedures today: urine culture, UDS  Plan:  Continue routine obstetrical care with anatomy u/s and 2nd IT at next visit  Reviewed: Preterm labor symptoms and general obstetric precautions including but not limited to vaginal bleeding, contractions, leaking of fluid and fetal movement were reviewed in detail with the patient.  All questions were answered. Has home bp cuff. Check bp weekly, let us know if >140/90.   Follow-up: Return in about 3 weeks (around 11/03/2019) for [place], [place]: Anatomy, in person, 2nd IT.  Orders Placed This Encounter  Procedures  . Urine Culture  . US OB Comp + 14 Wk  . Pain Management Screening Profile (10S)  . POC Urinalysis Dipstick OB   [facility] 10/13/2019 11:32 AM

## 2019-10-14 LAB — PMP SCREEN PROFILE (10S), URINE
Amphetamine Scrn, Ur: NEGATIVE ng/mL
BARBITURATE SCREEN URINE: NEGATIVE ng/mL
BENZODIAZEPINE SCREEN, URINE: NEGATIVE ng/mL
CANNABINOIDS UR QL SCN: POSITIVE ng/mL — AB
Cocaine (Metab) Scrn, Ur: NEGATIVE ng/mL
Creatinine(Crt), U: 56.5 mg/dL (ref 20.0–300.0)
Methadone Screen, Urine: NEGATIVE ng/mL
OXYCODONE+OXYMORPHONE UR QL SCN: NEGATIVE ng/mL
Opiate Scrn, Ur: NEGATIVE ng/mL
Ph of Urine: 7.4 (ref 4.5–8.9)
Phencyclidine Qn, Ur: NEGATIVE ng/mL
Propoxyphene Scrn, Ur: NEGATIVE ng/mL

## 2019-10-14 LAB — MED LIST OPTION NOT SELECTED

## 2019-10-15 LAB — URINE CULTURE

## 2019-10-17 LAB — CYTOLOGY - PAP
Comment: NEGATIVE
Comment: NEGATIVE
Diagnosis: HIGH — AB
HPV 16: NEGATIVE
HPV 18 / 45: NEGATIVE
High risk HPV: POSITIVE — AB

## 2019-10-30 ENCOUNTER — Encounter: Payer: BC Managed Care – PPO | Admitting: Obstetrics and Gynecology

## 2019-11-03 ENCOUNTER — Encounter: Payer: BC Managed Care – PPO | Admitting: Women's Health

## 2019-11-05 ENCOUNTER — Encounter: Payer: BC Managed Care – PPO | Admitting: Women's Health

## 2019-11-05 ENCOUNTER — Other Ambulatory Visit: Payer: BC Managed Care – PPO

## 2019-11-06 ENCOUNTER — Telehealth: Payer: Self-pay | Admitting: *Deleted

## 2019-11-06 ENCOUNTER — Telehealth: Payer: Self-pay | Admitting: Women's Health

## 2019-11-06 ENCOUNTER — Other Ambulatory Visit: Payer: Self-pay | Admitting: Women's Health

## 2019-11-06 MED ORDER — BUTALBITAL-APAP-CAFFEINE 50-325-40 MG PO TABS
1.0000 | ORAL_TABLET | ORAL | 0 refills | Status: DC | PRN
Start: 2019-11-06 — End: 2019-12-23

## 2019-11-06 NOTE — Telephone Encounter (Signed)
Patient states she is still having issues with headaches despite trying the recommendations given to her.

## 2019-11-06 NOTE — Telephone Encounter (Signed)
Patient called stating that she is pregnant and would like a call back from either Sherry [name] or her nurse. Pt did not state the reason for the call. Please contact pt

## 2019-11-12 ENCOUNTER — Encounter: Payer: Self-pay | Admitting: Obstetrics and Gynecology

## 2019-11-12 ENCOUNTER — Ambulatory Visit (INDEPENDENT_AMBULATORY_CARE_PROVIDER_SITE_OTHER): Payer: BC Managed Care – PPO | Admitting: Obstetrics and Gynecology

## 2019-11-12 VITALS — BP 114/71 | HR 111 | Wt 186.6 lb

## 2019-11-12 DIAGNOSIS — Z331 Pregnant state, incidental: Secondary | ICD-10-CM

## 2019-11-12 DIAGNOSIS — Z3A19 19 weeks gestation of pregnancy: Secondary | ICD-10-CM

## 2019-11-12 DIAGNOSIS — Z1389 Encounter for screening for other disorder: Secondary | ICD-10-CM

## 2019-11-12 DIAGNOSIS — Z3482 Encounter for supervision of other normal pregnancy, second trimester: Secondary | ICD-10-CM

## 2019-11-12 LAB — POCT URINALYSIS DIPSTICK OB
Blood, UA: NEGATIVE
Glucose, UA: NEGATIVE
Ketones, UA: NEGATIVE
Leukocytes, UA: NEGATIVE
Nitrite, UA: NEGATIVE
POC,PROTEIN,UA: NEGATIVE

## 2019-11-12 NOTE — Progress Notes (Signed)
PATIENT ID: Sherry [name], female     DOB: [birth year], 39 y.o.     MRN: [id number]   LOW-RISK PREGNANCY VISIT Patient name: Sherry [name] MRN [id number]  Date of birth: 1980/11/01 Chief Complaint:   Routine Prenatal Visit (colpo) She also has complaints today.  Upon completion of the colposcopy with dictated below the patient complained of headaches and work situational stresses these are summarized in the HPI History of Present Illness:   Sherry [name] is a 39 y.o. [phone] female at [redacted]w[redacted]d with an Estimated Date of Delivery: 04/03/20 being seen today for ongoing management of a low-risk pregnancy.  She is having recurrent headaches which she relates to possibly pregnancy but more likely to work stresses.  She has a difficult relationship with her immediate report and she finds to be unnecessarily critical.  She gets the headaches even with just thinking about the person.  She describes headaches as frontal and back of her head which sound like tension related headaches.  Eyes no vision changes or photophobia  We had quite a lengthy discussion about ways to change her approach to dealing with this individual instead of avoiding which she is to reach out to him where there is a good experience for a good day to build emotional capital with this individual, to use the phrase '" flip the coin" no need to look at a negative situation as potentially positive and react to the situation with that positive assumption.   She talks a lot about her's son Gerald Stabs who is age 105 delivered by me.  We talked about uplifting efforts at parenting and encouraged her to talk of our visit today.   Depression screen [facility] 2/9 09/17/2019 03/04/2019 02/18/2019  Decreased Interest 2 0 1  Down, Depressed, Hopeless 1 0 1  PHQ - 2 Score 3 0 2  Altered sleeping 0 - 3  Tired, decreased energy 3 - 3  Change in appetite 0 - 0  Feeling bad or failure about yourself  1 - 3  Trouble concentrating 0 - 2  Moving slowly or fidgety/restless 0  - 0  Suicidal thoughts 0 - 0  PHQ-9 Score 7 - 13    Today she reports headache. Contractions: Not present. Vag. Bleeding: None.  Movement: Present. denies leaking of fluid. Review of Systems:   Pertinent items are noted in HPI Denies abnormal vaginal discharge w/ itching/odor/irritation, headaches, visual changes, shortness of breath, chest pain, abdominal pain, severe nausea/vomiting, or problems with urination or bowel movements unless otherwise stated above. Pertinent History Reviewed:  Reviewed past medical,surgical, social, obstetrical and family history.  Reviewed problem list, medications and allergies. Physical Assessment:   Vitals:   11/12/19 1540  BP: 114/71  Pulse: (!) 111  Weight: 186 lb 9.6 oz (84.6 kg)  Body mass index is 33.05 kg/m.        Physical Examination:   General appearance: Well appearing, and in no distress  Mental status: Alert, oriented to person, place, and time  Skin: Warm & dry  Cardiovascular: Normal heart rate noted  Respiratory: Normal respiratory effort, no distress  Abdomen: Soft, gravid, nontender  Pelvic: Cervical exam deferred colpo done see below        Extremities: Edema: None  Fetal Status:     Movement: Present    Chaperone: n/a    Results for orders placed or performed in visit on 11/12/19 (from the past 24 hour(s))  POC Urinalysis Dipstick OB   Collection Time: 11/12/19  3:43  PM  Result Value Ref Range   Color, UA     Clarity, UA     Glucose, UA Negative Negative   Bilirubin, UA     Ketones, UA neg    Spec Grav, UA     Blood, UA neg    pH, UA     POC,PROTEIN,UA Negative Negative, Trace, Small (1+), Moderate (2+), Large (3+), 4+   Urobilinogen, UA     Nitrite, UA neg    Leukocytes, UA Negative Negative   Appearance     Odor      Assessment & Plan:  1) Low-risk pregnancy [id number] at [redacted]w[redacted]d with an Estimated Date of Delivery: 04/03/20   2) situational stressors.,  At least 25 minutes spent in direct conversation over the  issues of situational stressors, work issues and attitude changes that may give her strength I have signed a out of work note for today's visit flanking the super visor for allowing her of the opportunity to have the visit today   Meds: No orders of the defined types were placed in this encounter.  Labs/procedures today: colpo  Plan:  Continue routine obstetrical care with appt q 4 wk for now. Next visit: prefers in person    Reviewed:  labor symptoms and general obstetric precautions including but not limited to vaginal bleeding, contractions, leaking of fluid and fetal movement were reviewed in detail with the patient.  All questions were answered. has home bp cuff. . Check bp weekly, let us know if >140/90.   Follow-up: Return for As Scheduled.  Orders Placed This Encounter  Procedures  . POC Urinalysis Dipstick OB   Jonnie Kind CNM, [facility] 11/12/2019 5:32 PM   Sherry [name] 39 y.o. [id number] here for colposcopy for high-grade squamous intraepithelial neoplasia  (HGSIL-encompassing moderate and severe dysplasia) pap smear on 11/18/2019.  Discussed role for HPV in cervical dysplasia, need for surveillance.  Patient given informed consent, signed copy in the chart, time out was performed.  Placed in lithotomy position. Cervix viewed with speculum and colposcope after application of acetic acid.   Colposcopy adequate? Yes  no visible lesions;Marland Kitchen   ECC specimen obtained.no due to pregnancy All specimens were labelled and sent to pathology.   Colposcopy IMPRESSION:  Patient was given post procedure instructions. Will follow up pathology and manage accordingly.  Routine preventative health maintenance measures emphasized.   She notes that she has had severe headaches that radiate from her frontal bone to her occipital bone. She states that she is worried about the possibility of another miscarriage. She is also stressed due to work.  She has an up to date eye exam.    By signing  my name below, I, [employer], attest that this documentation has been prepared under the direction and in the presence of Jonnie Kind, MD. Electronically Signed: [place]. 11/12/19. 3:47 PM.  I personally performed the services described in this documentation, which was SCRIBED in my presence. The recorded information has been reviewed and considered accurate. It has been edited as necessary during review. Jonnie Kind, MD     L

## 2019-11-12 NOTE — Patient Instructions (Addendum)

## 2019-11-17 ENCOUNTER — Other Ambulatory Visit: Payer: Self-pay | Admitting: Advanced Practice Midwife

## 2019-11-17 DIAGNOSIS — Z363 Encounter for antenatal screening for malformations: Secondary | ICD-10-CM

## 2019-11-17 DIAGNOSIS — Z3480 Encounter for supervision of other normal pregnancy, unspecified trimester: Secondary | ICD-10-CM

## 2019-11-18 ENCOUNTER — Encounter: Payer: Self-pay | Admitting: Women's Health

## 2019-11-18 ENCOUNTER — Ambulatory Visit (INDEPENDENT_AMBULATORY_CARE_PROVIDER_SITE_OTHER): Payer: BC Managed Care – PPO | Admitting: Women's Health

## 2019-11-18 ENCOUNTER — Ambulatory Visit (INDEPENDENT_AMBULATORY_CARE_PROVIDER_SITE_OTHER): Payer: BC Managed Care – PPO

## 2019-11-18 VITALS — BP 114/80 | HR 97 | Wt 184.0 lb

## 2019-11-18 DIAGNOSIS — Z363 Encounter for antenatal screening for malformations: Secondary | ICD-10-CM | POA: Diagnosis not present

## 2019-11-18 DIAGNOSIS — Z1379 Encounter for other screening for genetic and chromosomal anomalies: Secondary | ICD-10-CM

## 2019-11-18 DIAGNOSIS — Z331 Pregnant state, incidental: Secondary | ICD-10-CM

## 2019-11-18 DIAGNOSIS — Z3480 Encounter for supervision of other normal pregnancy, unspecified trimester: Secondary | ICD-10-CM

## 2019-11-18 DIAGNOSIS — Z1389 Encounter for screening for other disorder: Secondary | ICD-10-CM | POA: Diagnosis not present

## 2019-11-18 DIAGNOSIS — F1290 Cannabis use, unspecified, uncomplicated: Secondary | ICD-10-CM

## 2019-11-18 DIAGNOSIS — Z3482 Encounter for supervision of other normal pregnancy, second trimester: Secondary | ICD-10-CM

## 2019-11-18 DIAGNOSIS — O09522 Supervision of elderly multigravida, second trimester: Secondary | ICD-10-CM

## 2019-11-18 DIAGNOSIS — Z3A20 20 weeks gestation of pregnancy: Secondary | ICD-10-CM | POA: Diagnosis not present

## 2019-11-18 DIAGNOSIS — R8271 Bacteriuria: Secondary | ICD-10-CM

## 2019-11-18 DIAGNOSIS — F17200 Nicotine dependence, unspecified, uncomplicated: Secondary | ICD-10-CM

## 2019-11-18 DIAGNOSIS — R87619 Unspecified abnormal cytological findings in specimens from cervix uteri: Secondary | ICD-10-CM | POA: Insufficient documentation

## 2019-11-18 DIAGNOSIS — O3412 Maternal care for benign tumor of corpus uteri, second trimester: Secondary | ICD-10-CM

## 2019-11-18 LAB — POCT URINALYSIS DIPSTICK OB
Blood, UA: NEGATIVE
Glucose, UA: NEGATIVE
Ketones, UA: NEGATIVE
Leukocytes, UA: NEGATIVE
Nitrite, UA: NEGATIVE
POC,PROTEIN,UA: NEGATIVE

## 2019-11-18 NOTE — Progress Notes (Signed)
[place] 20+3 wks,transverse,cx 3.8 cm,posterior fundal placenta gr 0,normal right ovary,simple left ovarian cyst 2.8 x 2.3 x 1.9 cm,svp of fluid 5.3 cm,fhr 138 bpm,mult fibroids (#1) anterior 3.4 x 2.6 x 3.1 cm,(#2) anterior 4.6 x  3.5 x 2.7 cm,(#3) posterior 2.8 x 1.7 x 2.5 cm,EFW 339 g 33%,anatomy complete,no obvious abnormalities

## 2019-11-18 NOTE — Patient Instructions (Signed)
Sherry [name], I greatly value your feedback.  If you receive a survey following your visit with [place] today, we appreciate you taking the time to fill it out.  Thanks, Knute Neu, CNM, WHNP-BC  [facility] [place] at [facility] ([place], [place] [address]) Entrance C, located off of [place] parking  Go to [email or web address] to register for FREE online childbirth classes  [place] Pediatricians/Family Doctors:  [facility] [place] [phone]                 [place] [phone] (usually not accepting new patients unless you have family there already, you are always welcome to call and ask)       [facility] [phone]       [facility] Pediatricians/Family Doctors:   [facility]: [phone]  [facility]: [phone]  [facility]: [place] Doctors:   [facility]: [facility]: [place]:  [place]: [phone]    Home Blood Pressure Monitoring for Patients   Your provider has recommended that you check your blood pressure (BP) at least once a week at home. If you do not have a blood pressure cuff at home, one will be provided for you. Contact your provider if you have not received your monitor within 1 week.   Helpful Tips for Accurate Home Blood Pressure Checks  . Don't smoke, exercise, or drink caffeine 30 minutes before checking your BP . Use the restroom before checking your BP (a full bladder can raise your pressure) . Relax in a comfortable upright chair . Feet on the ground . Left arm resting comfortably on a flat surface at the level of your heart . Legs uncrossed . Back supported . Sit quietly and don't talk . Place the cuff on your bare arm . Adjust snuggly, so that  only two fingertips can fit between your skin and the top of the cuff . Check 2 readings separated by at least one minute . Keep a log of your BP readings . For a visual, please reference this diagram: http://ccnc.care/bpdiagram  Provider Name: [facility]     Phone: [phone]  Zone 1: ALL CLEAR  Continue to monitor your symptoms:  . BP reading is less than 140 (top number) or less than 90 (bottom number)  . No right upper stomach pain . No headaches or seeing spots . No feeling nauseated or throwing up . No swelling in face and hands  Zone 2: CAUTION Call your doctor's office for any of the following:  . BP reading is greater than 140 (top number) or greater than 90 (bottom number)  . Stomach pain under your ribs in the middle or right side . Headaches or seeing spots . Feeling nauseated or throwing up . Swelling in face and hands  Zone 3: EMERGENCY  Seek immediate medical care if you have any of the following:  . BP reading is greater than160 (top number) or greater than 110 (bottom number) . Severe headaches not improving with Tylenol . Serious difficulty catching your breath . Any worsening symptoms from Zone 2     Second Trimester of Pregnancy The second trimester is from week 14 through week 27 (months 4 through 6). The second trimester is often a time when you feel your  best. Your body has adjusted to being pregnant, and you begin to feel better physically. Usually, morning sickness has lessened or quit completely, you may have more energy, and you may have an increase in appetite. The second trimester is also a time when the fetus is growing rapidly. At the end of the sixth month, the fetus is about 9 inches long and weighs about 1 pounds. You will likely begin to feel the baby move (quickening) between 16 and 20 weeks of pregnancy. Body changes during your second trimester Your body continues to go through many changes during your second trimester. The changes  vary from woman to woman.  Your weight will continue to increase. You will notice your lower abdomen bulging out.  You may begin to get stretch marks on your hips, abdomen, and breasts.  You may develop headaches that can be relieved by medicines. The medicines should be approved by your health care provider.  You may urinate more often because the fetus is pressing on your bladder.  You may develop or continue to have heartburn as a result of your pregnancy.  You may develop constipation because certain hormones are causing the muscles that push waste through your intestines to slow down.  You may develop hemorrhoids or swollen, bulging veins (varicose veins).  You may have back pain. This is caused by: ? Weight gain. ? Pregnancy hormones that are relaxing the joints in your pelvis. ? A shift in weight and the muscles that support your balance.  Your breasts will continue to grow and they will continue to become tender.  Your gums may bleed and may be sensitive to brushing and flossing.  Dark spots or blotches (chloasma, mask of pregnancy) may develop on your face. This will likely fade after the baby is born.  A dark line from your belly button to the pubic area (linea nigra) may appear. This will likely fade after the baby is born.  You may have changes in your hair. These can include thickening of your hair, rapid growth, and changes in texture. Some women also have hair loss during or after pregnancy, or hair that feels dry or thin. Your hair will most likely return to normal after your baby is born.  What to expect at prenatal visits During a routine prenatal visit:  You will be weighed to make sure you and the fetus are growing normally.  Your blood pressure will be taken.  Your abdomen will be measured to track your baby's growth.  The fetal heartbeat will be listened to.  Any test results from the previous visit will be discussed.  Your health care provider may  ask you:  How you are feeling.  If you are feeling the baby move.  If you have had any abnormal symptoms, such as leaking fluid, bleeding, severe headaches, or abdominal cramping.  If you are using any tobacco products, including cigarettes, chewing tobacco, and electronic cigarettes.  If you have any questions.  Other tests that may be performed during your second trimester include:  Blood tests that check for: ? Low iron levels (anemia). ? High blood sugar that affects pregnant women (gestational diabetes) between 79 and 28 weeks. ? Rh antibodies. This is to check for a protein on red blood cells (Rh factor).  Urine tests to check for infections, diabetes, or protein in the urine.  An ultrasound to confirm the proper growth and development of the baby.  An amniocentesis to check for possible genetic problems.  Fetal  screens for spina bifida and Down syndrome.  HIV (human immunodeficiency virus) testing. Routine prenatal testing includes screening for HIV, unless you choose not to have this test.  Follow these instructions at home: Medicines  Follow your health care provider's instructions regarding medicine use. Specific medicines may be either safe or unsafe to take during pregnancy.  Take a prenatal vitamin that contains at least 600 micrograms (mcg) of folic acid.  If you develop constipation, try taking a stool softener if your health care provider approves. Eating and drinking  Eat a balanced diet that includes fresh fruits and vegetables, whole grains, good sources of protein such as meat, eggs, or tofu, and low-fat dairy. Your health care provider will help you determine the amount of weight gain that is right for you.  Avoid raw meat and uncooked cheese. These carry germs that can cause birth defects in the baby.  If you have low calcium intake from food, talk to your health care provider about whether you should take a daily calcium supplement.  Limit foods  that are high in fat and processed sugars, such as fried and sweet foods.  To prevent constipation: ? Drink enough fluid to keep your urine clear or pale yellow. ? Eat foods that are high in fiber, such as fresh fruits and vegetables, whole grains, and beans. Activity  Exercise only as directed by your health care provider. Most women can continue their usual exercise routine during pregnancy. Try to exercise for 30 minutes at least 5 days a week. Stop exercising if you experience uterine contractions.  Avoid heavy lifting, wear low heel shoes, and practice good posture.  A sexual relationship may be continued unless your health care provider directs you otherwise. Relieving pain and discomfort  Wear a good support bra to prevent discomfort from breast tenderness.  Take warm sitz baths to soothe any pain or discomfort caused by hemorrhoids. Use hemorrhoid cream if your health care provider approves.  Rest with your legs elevated if you have leg cramps or low back pain.  If you develop varicose veins, wear support hose. Elevate your feet for 15 minutes, 3-4 times a day. Limit salt in your diet. Prenatal Care  Write down your questions. Take them to your prenatal visits.  Keep all your prenatal visits as told by your health care provider. This is important. Safety  Wear your seat belt at all times when driving.  Make a list of emergency phone numbers, including numbers for family, friends, the hospital, and police and fire departments. General instructions  Ask your health care provider for a referral to a local prenatal education class. Begin classes no later than the beginning of month 6 of your pregnancy.  Ask for help if you have counseling or nutritional needs during pregnancy. Your health care provider can offer advice or refer you to specialists for help with various needs.  Do not use hot tubs, steam rooms, or saunas.  Do not douche or use tampons or scented sanitary  pads.  Do not cross your legs for long periods of time.  Avoid cat litter boxes and soil used by cats. These carry germs that can cause birth defects in the baby and possibly loss of the fetus by miscarriage or stillbirth.  Avoid all smoking, herbs, alcohol, and unprescribed drugs. Chemicals in these products can affect the formation and growth of the baby.  Do not use any products that contain nicotine or tobacco, such as cigarettes and e-cigarettes. If you need help  quitting, ask your health care provider.  Visit your dentist if you have not gone yet during your pregnancy. Use a soft toothbrush to brush your teeth and be gentle when you floss. Contact a health care provider if:  You have dizziness.  You have mild pelvic cramps, pelvic pressure, or nagging pain in the abdominal area.  You have persistent nausea, vomiting, or diarrhea.  You have a bad smelling vaginal discharge.  You have pain when you urinate. Get help right away if:  You have a fever.  You are leaking fluid from your vagina.  You have spotting or bleeding from your vagina.  You have severe abdominal cramping or pain.  You have rapid weight gain or weight loss.  You have shortness of breath with chest pain.  You notice sudden or extreme swelling of your face, hands, ankles, feet, or legs.  You have not felt your baby move in over an hour.  You have severe headaches that do not go away when you take medicine.  You have vision changes. Summary  The second trimester is from week 14 through week 27 (months 4 through 6). It is also a time when the fetus is growing rapidly.  Your body goes through many changes during pregnancy. The changes vary from woman to woman.  Avoid all smoking, herbs, alcohol, and unprescribed drugs. These chemicals affect the formation and growth your baby.  Do not use any tobacco products, such as cigarettes, chewing tobacco, and e-cigarettes. If you need help quitting, ask your  health care provider.  Contact your health care provider if you have any questions. Keep all prenatal visits as told by your health care provider. This is important. This information is not intended to replace advice given to you by your health care provider. Make sure you discuss any questions you have with your health care provider. Document Released: 02/28/2001 Document Revised: 08/12/2015 Document Reviewed: 05/07/2012 Elsevier Interactive Patient Education  2017 [employer].

## 2019-11-18 NOTE — Progress Notes (Addendum)
LOW-RISK PREGNANCY VISIT Patient name: Sherry [name] MRN [id number]  Date of birth: 1980/08/12 Chief Complaint:   Routine Prenatal Visit  History of Present Illness:   Sherry [name] is a 39 y.o. [id number] female at [redacted]w[redacted]d with an Estimated Date of Delivery: 04/03/20 being seen today for ongoing management of a low-risk pregnancy.  Depression screen [facility] 2/9 09/17/2019 03/04/2019 02/18/2019  Decreased Interest 2 0 1  Down, Depressed, Hopeless 1 0 1  PHQ - 2 Score 3 0 2  Altered sleeping 0 - 3  Tired, decreased energy 3 - 3  Change in appetite 0 - 0  Feeling bad or failure about yourself  1 - 3  Trouble concentrating 0 - 2  Moving slowly or fidgety/restless 0 - 0  Suicidal thoughts 0 - 0  PHQ-9 Score 7 - 13    Today she reports headaches improving, constipation. Contractions: Not present. Vag. Bleeding: None.  Movement: Present. denies leaking of fluid. Review of Systems:   Pertinent items are noted in HPI Denies abnormal vaginal discharge w/ itching/odor/irritation, headaches, visual changes, shortness of breath, chest pain, abdominal pain, severe nausea/vomiting, or problems with urination or bowel movements unless otherwise stated above. Pertinent History Reviewed:  Reviewed past medical,surgical, social, obstetrical and family history.  Reviewed problem list, medications and allergies. Physical Assessment:   Vitals:   11/18/19 1503  BP: 114/80  Pulse: 97  Weight: 184 lb (83.5 kg)  Body mass index is 32.59 kg/m.        Physical Examination:   General appearance: Well appearing, and in no distress  Mental status: Alert, oriented to person, place, and time  Skin: Warm & dry  Cardiovascular: Normal heart rate noted  Respiratory: Normal respiratory effort, no distress  Abdomen: Soft, gravid, nontender  Pelvic: Cervical exam deferred         Extremities: Edema: None  Fetal Status: Fetal Heart Rate (bpm): 138 u/s   Movement: Present    [place] 20+3 wks,transverse,cx 3.8 cm,posterior  fundal placenta gr 0,normal right ovary,simple left ovarian cyst 2.8 x 2.3 x 1.9 cm,svp of fluid 5.3 cm,fhr 138 bpm,mult fibroids (#1) anterior 3.4 x 2.6 x 3.1 cm,(#2) anterior 4.6 x  3.5 x 2.7 cm,(#3) posterior 2.8 x 1.7 x 2.5 cm,EFW 339 g 33%,anatomy complete,no obvious abnormalities   Chaperone: n/a    Results for orders placed or performed in visit on 11/18/19 (from the past 24 hour(s))  POC Urinalysis Dipstick OB   Collection Time: 11/18/19  3:05 PM  Result Value Ref Range   Color, UA     Clarity, UA     Glucose, UA Negative Negative   Bilirubin, UA     Ketones, UA neg    Spec Grav, UA     Blood, UA neg    pH, UA     POC,PROTEIN,UA Negative Negative, Trace, Small (1+), Moderate (2+), Large (3+), 4+   Urobilinogen, UA     Nitrite, UA neg    Leukocytes, UA Negative Negative   Appearance     Odor      Assessment & Plan:  1) Low-risk pregnancy [id number] at 105w3d with an Estimated Date of Delivery: 04/03/20   2) Multiple fibroids, stable on u/s today  3) Constipation> gave printed prevention/relief measures    Meds: No orders of the defined types were placed in this encounter.  Labs/procedures today: anatomy u/s, 2nd IT  Plan:  Continue routine obstetrical care  Next visit: prefers in person    Reviewed: Preterm  labor symptoms and general obstetric precautions including but not limited to vaginal bleeding, contractions, leaking of fluid and fetal movement were reviewed in detail with the patient.  All questions were answered.   Follow-up: Return in about 4 weeks (around 12/16/2019) for [place], [place].  Orders Placed This Encounter  Procedures  . POC Urinalysis Dipstick OB   Roma Schanz CNM, [facility] 11/18/2019 3:27 PM

## 2019-11-20 LAB — INTEGRATED 2
AFP MoM: 1.9
Alpha-Fetoprotein: 102.8 ng/mL
Crown Rump Length: 46.3 mm
DIA MoM: 1.36
DIA Value: 226.8 pg/mL
Estriol, Unconjugated: 0.97 ng/mL
Gest. Age on Collection Date: 11.3 weeks
Gestational Age: 20.1 weeks
Maternal Age at EDD: 38.2 yr
Nuchal Translucency (NT): 1 mm
Nuchal Translucency MoM: 0.95
Number of Fetuses: 1
PAPP-A MoM: 2.17
PAPP-A Value: 1067.9 ng/mL
Test Results:: NEGATIVE
Weight: 180 [lb_av]
Weight: 180 [lb_av]
hCG MoM: 2.21
hCG Value: 42.4 IU/mL
uE3 MoM: 0.45

## 2019-12-10 ENCOUNTER — Encounter: Payer: Self-pay | Admitting: Obstetrics and Gynecology

## 2019-12-10 ENCOUNTER — Ambulatory Visit (INDEPENDENT_AMBULATORY_CARE_PROVIDER_SITE_OTHER): Payer: BC Managed Care – PPO | Admitting: Obstetrics and Gynecology

## 2019-12-10 VITALS — BP 132/75 | HR 112 | Wt 189.0 lb

## 2019-12-10 DIAGNOSIS — Z3A23 23 weeks gestation of pregnancy: Secondary | ICD-10-CM

## 2019-12-10 DIAGNOSIS — Z3480 Encounter for supervision of other normal pregnancy, unspecified trimester: Secondary | ICD-10-CM

## 2019-12-10 DIAGNOSIS — Z331 Pregnant state, incidental: Secondary | ICD-10-CM

## 2019-12-10 DIAGNOSIS — Z1389 Encounter for screening for other disorder: Secondary | ICD-10-CM

## 2019-12-10 LAB — POCT URINALYSIS DIPSTICK OB
Glucose, UA: NEGATIVE
Ketones, UA: NEGATIVE
Leukocytes, UA: NEGATIVE
Nitrite, UA: NEGATIVE
POC,PROTEIN,UA: NEGATIVE

## 2019-12-10 NOTE — Progress Notes (Addendum)
Patient ID: Sherry [name], female   DOB: [birth year], 39 y.o.   MRN: [id number]    LOW-RISK PREGNANCY VISIT Patient name: Sherry [name] MRN [id number]  Date of birth: 08/22/1980 Chief Complaint:   Routine Prenatal Visit  History of Present Illness:   Sherry HAEVYN URY is a 39 y.o. [id number] female at 104w4d with an Estimated Date of Delivery: 04/03/20 being seen today for ongoing management of a low-risk pregnancy.  Depression screen [facility] 2/9 09/17/2019 03/04/2019 02/18/2019  Decreased Interest 2 0 1  Down, Depressed, Hopeless 1 0 1  PHQ - 2 Score 3 0 2  Altered sleeping 0 - 3  Tired, decreased energy 3 - 3  Change in appetite 0 - 0  Feeling bad or failure about yourself  1 - 3  Trouble concentrating 0 - 2  Moving slowly or fidgety/restless 0 - 0  Suicidal thoughts 0 - 0  PHQ-9 Score 7 - 13    Today she reports continued "severe back and pelvic pain. "The pain improves if she takes a hot shower and lays down for a while. She cannot remember if she had this pain with her previous pregnancies.  She bought a support belt and pregnancy pillow from [employer] but they did not help.  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 She has been drinking a lot of water.   This is going to be her last pregnancy.   Contractions: Irritability. Vag. Bleeding: None.  Movement: Present. denies leaking of fluid. Review of Systems:   Pertinent items are noted in HPI Denies abnormal vaginal discharge w/ itching/odor/irritation, headaches, visual changes, shortness of breath, chest pain, abdominal pain, severe nausea/vomiting, or problems with urination or bowel movements unless otherwise stated above. Pertinent History Reviewed:  Reviewed past medical,surgical, social, obstetrical and family history.  Reviewed problem list, medications and allergies. Physical Assessment:   Vitals:   12/10/19 1556  BP: 132/75  Pulse: (!) 112  Weight: 189 lb (85.7 kg)  Body mass  index is 33.48 kg/m.        Physical Examination:   General appearance: Well appearing, and in no distress  Mental status: Alert, oriented to person, place, and time  Skin: Warm & dry  Cardiovascular: Normal heart rate noted  Respiratory: Normal respiratory effort, no distress  Abdomen: Soft, gravid, nontender  Pelvic: Cervical exam deferred         Extremities: Edema: Trace  Fetal Status: Fetal Heart Rate (bpm): 154   Movement: Present    Chaperone: De Burrs    Results for orders placed or performed in visit on 12/10/19 (from the past 24 hour(s))  POC Urinalysis Dipstick OB   Collection Time: 12/10/19  3:53 PM  Result Value Ref Range   Color, UA     Clarity, UA     Glucose, UA Negative Negative   Bilirubin, UA     Ketones, UA n    Spec Grav, UA     Blood, UA moderate    pH, UA     POC,PROTEIN,UA Negative Negative, Trace, Small (1+), Moderate (2+), Large (3+), 4+   Urobilinogen, UA     Nitrite, UA n    Leukocytes, UA Negative Negative   Appearance     Odor      Assessment & Plan:  1) Low-risk pregnancy [id number] at [redacted]w[redacted]d with an Estimated Date of Delivery: 04/03/20   2) Increased back pain and suprapubic pain, likely  related to fibroids and enlarges uterus. Plan for gradual reduction of work hours as patient can tolerate. Pt to reduce amount on carts, ease pushing efforts to accomodate body changes.   Meds: No orders of the defined types were placed in this encounter.  Labs/procedures today: none  Plan:  Continue routine obstetrical care Next visit: prefers in person    Reviewed: Preterm labor symptoms and general obstetric precautions including but not limited to vaginal bleeding, contractions, leaking of fluid and fetal movement were reviewed in detail with the patient.  All questions were answered. Check bp weekly, let us know if >140/90.   Follow-up: Return in about 4 weeks (around 01/07/2020) for [place].  Orders Placed This Encounter  Procedures  . POC  Urinalysis Dipstick OB   12/10/2019 4:23 PM  By signing my name below, I, De Burrs, attest that this documentation has been prepared under the direction and in the presence of Jonnie Kind, MD. Electronically Signed: De Burrs, Medical Scribe. 12/10/19. 4:23 PM.  I personally performed the services described in this documentation, which was SCRIBED in my presence. The recorded information has been reviewed and considered accurate. It has been edited as necessary during review. Jonnie Kind, MD

## 2019-12-15 ENCOUNTER — Encounter: Payer: BC Managed Care – PPO | Admitting: Women's Health

## 2019-12-23 ENCOUNTER — Other Ambulatory Visit: Payer: Self-pay | Admitting: Women's Health

## 2019-12-23 MED ORDER — BUTALBITAL-APAP-CAFFEINE 50-325-40 MG PO TABS: 1.0000 | ORAL_TABLET | ORAL | 0 refills | Status: DC | PRN

## 2019-12-29 ENCOUNTER — Telehealth: Payer: Self-pay | Admitting: Advanced Practice Midwife

## 2019-12-29 NOTE — Telephone Encounter (Signed)
Patient called stating that her Job is suppose to be faxing over some paperwork for [place] to fill, pt would like to speak with a nurse regarding the information on the paperwork and why. Please contact pt

## 2019-12-30 ENCOUNTER — Telehealth: Payer: Self-pay | Admitting: *Deleted

## 2019-12-30 NOTE — Telephone Encounter (Signed)
Patient states she has asked her HR to send over FMLA forms for intermittent leave as she has missed work due to headaches, back pain.  Has also had to leave work due to pain from pulling and pushing carts.  Informed I would look for the forms and work on them as soon as I was able.  Pt verbalized understanding.

## 2020-01-05 ENCOUNTER — Ambulatory Visit (INDEPENDENT_AMBULATORY_CARE_PROVIDER_SITE_OTHER): Payer: BC Managed Care – PPO | Admitting: Advanced Practice Midwife

## 2020-01-05 VITALS — BP 131/84 | HR 110 | Wt 195.0 lb

## 2020-01-05 DIAGNOSIS — Z3483 Encounter for supervision of other normal pregnancy, third trimester: Secondary | ICD-10-CM

## 2020-01-05 DIAGNOSIS — Z331 Pregnant state, incidental: Secondary | ICD-10-CM

## 2020-01-05 DIAGNOSIS — Z3A27 27 weeks gestation of pregnancy: Secondary | ICD-10-CM

## 2020-01-05 DIAGNOSIS — Z1389 Encounter for screening for other disorder: Secondary | ICD-10-CM

## 2020-01-05 DIAGNOSIS — Z3480 Encounter for supervision of other normal pregnancy, unspecified trimester: Secondary | ICD-10-CM

## 2020-01-05 LAB — POCT URINALYSIS DIPSTICK OB
Blood, UA: NEGATIVE
Glucose, UA: NEGATIVE
Ketones, UA: NEGATIVE
Leukocytes, UA: NEGATIVE
Nitrite, UA: NEGATIVE
POC,PROTEIN,UA: NEGATIVE

## 2020-01-05 MED ORDER — BLOOD PRESSURE MONITOR AUTOMAT DEVI: 0 refills | Status: DC

## 2020-01-05 NOTE — Progress Notes (Signed)
LOW-RISK PREGNANCY VISIT Patient name: Sherry [name] MRN [id number]  Date of birth: 1980-10-19 Chief Complaint:   Routine Prenatal Visit  History of Present Illness:   Sherry [name] is a 39 y.o. [id number] female at [redacted]w[redacted]d with an Estimated Date of Delivery: 04/03/20 being seen today for ongoing management of a low-risk pregnancy.  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  Doesn't want to change jobs because she would lose her pay rate. Has FMLA already filled out.  Will let us know if there is anything else we can do to help. . Contractions: Irritability. Vag. Bleeding: None.  Movement: Present. denies leaking of fluid. Review of Systems:   Pertinent items are noted in HPI Denies abnormal vaginal discharge w/ itching/odor/irritation, headaches, visual changes, shortness of breath, chest pain, abdominal pain, severe nausea/vomiting, or problems with urination or bowel movements unless otherwise stated above. Pertinent History Reviewed:  Reviewed past medical,surgical, social, obstetrical and family history.  Reviewed problem list, medications and allergies. Physical Assessment:   Vitals:   01/05/20 1013  BP: 131/84  Pulse: (!) 110  Weight: 195 lb (88.5 kg)  Body mass index is 34.54 kg/m.        Physical Examination:   General appearance: Well appearing, and in no distress  Mental status: Alert, oriented to person, place, and time  Skin: Warm & dry  Cardiovascular: Normal heart rate noted  Respiratory: Normal respiratory effort, no distress  Abdomen: Soft, gravid, nontender  Pelvic: Cervical exam deferred         Extremities: Edema: Trace  Fetal Status:     Movement: Present    Chaperone: n/a    Results for orders placed or performed in visit on 01/05/20 (from the past 24 hour(s))  POC Urinalysis Dipstick OB   Collection Time: 01/05/20 10:16 AM  Result Value Ref Range   Color, UA     Clarity, UA     Glucose, UA  Negative Negative   Bilirubin, UA     Ketones, UA neg    Spec Grav, UA     Blood, UA neg    pH, UA     POC,PROTEIN,UA Negative Negative, Trace, Small (1+), Moderate (2+), Large (3+), 4+   Urobilinogen, UA     Nitrite, UA neg    Leukocytes, UA Negative Negative   Appearance     Odor      Assessment & Plan:  1) Low-risk pregnancy [name] at [redacted]w[redacted]d with an Estimated Date of Delivery: 04/03/20      Meds:  Meds ordered this encounter  Medications  . Blood Pressure Monitoring (BLOOD PRESSURE MONITOR AUTOMAT) DEVI    Sig: Take BP at home daily.  Alert [place] if >140/90 more than once.    Dispense:  1 each    Refill:  0    Order Specific Question:   Supervising Provider    Answer:   Florian Buff [[id number]]   Labs/procedures today: none  Plan:  Continue routine obstetrical care  Next visit: prefers in person    Reviewed: Preterm labor symptoms and general obstetric precautions including but not limited to vaginal bleeding, contractions, leaking of fluid and fetal movement were reviewed in detail with the patient.  All questions were answered. Doesn't have a  home bp cuff. Rx faxed . Check bp weekly, let us know if >140/90.   Follow-up: Return in 3 weeks (on 01/26/2020) for LROB and ASAP for PN2  only.  Orders Placed This Encounter  Procedures  . POC Urinalysis Dipstick OB   Christin Fudge DNP, CNM 01/05/2020 11:06 AM

## 2020-01-05 NOTE — Patient Instructions (Addendum)
1. Before your test, do not eat or drink anything for 8-10 hours prior to your  appointment (a small amount of water is allowed and you may take any medicines you normally take). Be sure to drink lots of water the day before. 2. When you arrive, your blood will be drawn for a 'fasting' blood sugar level.  Then you will be given a sweetened carbonated beverage to drink. You should  complete drinking this beverage within five minutes. After finishing the  beverage, you will have your blood drawn exactly 1 and 2 hours later. Having  your blood drawn on time is an important part of this test. A total of three blood  samples will be done. 3. The test takes approximately 2  hours. During the test, do not have anything to  eat or drink. Do not smoke, chew gum (not even sugarless gum) or use breath mints.  4. During the test you should remain close by and seated as much as possible and  avoid walking around. You may want to bring a book or something else to  occupy your time.  5. After your test, you may eat and drink as normal. You may want to bring a snack  to eat after the test is finished. Your provider will advise you as to the results of  this test and any follow-up if necessary  If your sugar test is positive for gestational diabetes, you will be given an phone call and further instructions discussed. If you wish to know all of your test results before your next appointment, feel free to call the office, or look up your test results on Mychart.  (The range that the lab uses for normal values of the sugar test are not necessarily the range that is used for pregnant women; if your results are within the normal range, they are definitely normal.  However, if a value is deemed "high" by the lab, it may not be too high for a pregnant woman.  We will need to discuss the results if your value(s) fall in the "high" category).     Tdap Vaccine  It is recommended that you get the Tdap vaccine during the  third trimester of EACH pregnancy to help protect your baby from getting pertussis (whooping cough)  27-36 weeks is the BEST time to do this so that you can pass the protection on to your baby. During pregnancy is better than after pregnancy, but if you are unable to get it during pregnancy it will be offered at the hospital.  You will be offered this vaccine in the office after 27 weeks.  If you do not have health insurance, you can get the vaccine from the [facility] Department (no appointment needed).   Everyone who will be around your baby should also be up-to-date on their vaccines. Adults (who are not pregnant) only need 1 dose of Tdap during adulthood.     For Headaches:   Stay well hydrated, drink enough water so that your urine is clear, sometimes if you are dehydrated you can get headaches  Eat small frequent meals and snacks, sometimes if you are hungry you can get headaches  Sometimes you get headaches during pregnancy from the pregnancy hormones  You can try tylenol (1-2 regular strength 325mg  or 1-2 extra strength 500mg ) as directed on the box. The least amount of medication that works is best.   Cool compresses (cool wet washcloth or ice pack) to area of head  that is hurting  You can also try drinking a caffeinated drink to see if this will help  If not helping, try below:  For Prevention of Headaches/Migraines:  CoQ10 100mg  three times daily  Vitamin B2 400mg  daily  Magnesium Oxide 400-600mg  daily  If You Get a Bad Headache/Migraine:  Benadryl 25mg    Magnesium Oxide  1 large Gatorade  2 extra strength Tylenol (1,000mg  total)  1 cup coffee or Coke  If this doesn't help please call us @ [phone]  Meet the Provider [place] [facility] is now offering FREE monthly 1-hour virtual Zoom sessions for new, current, and prospective patients.        During these sessions, you can:   Learn  about our practice, model of care, services   Get answers to questions about pregnancy and birth during [place] your provider's brain about anything else!    Sessions will be hosted by [employer] for [employer] and midwives    No registration required      2021 Dates:      All at 6pm     October 21st     November 18th   December 16th     January 20th  February 17th       Copy/paste the link into your web browser:  [email or web address]    OR  Scan the QR code below (open up your camera and point towards QR code; click on tab that pops up on your phone ("zoom")

## 2020-01-08 ENCOUNTER — Other Ambulatory Visit: Payer: BC Managed Care – PPO

## 2020-01-08 ENCOUNTER — Encounter: Payer: Self-pay | Admitting: Obstetrics & Gynecology

## 2020-01-08 DIAGNOSIS — Z3A27 27 weeks gestation of pregnancy: Secondary | ICD-10-CM

## 2020-01-08 DIAGNOSIS — Z3483 Encounter for supervision of other normal pregnancy, third trimester: Secondary | ICD-10-CM

## 2020-01-09 LAB — HIV ANTIBODY (ROUTINE TESTING W REFLEX): HIV Screen 4th Generation wRfx: NONREACTIVE

## 2020-01-09 LAB — CBC
Hematocrit: 31.1 % — ABNORMAL LOW (ref 34.0–46.6)
Hemoglobin: 10.2 g/dL — ABNORMAL LOW (ref 11.1–15.9)
MCH: 30.9 pg (ref 26.6–33.0)
MCHC: 32.8 g/dL (ref 31.5–35.7)
MCV: 94 fL (ref 79–97)
Platelets: 195 10*3/uL (ref 150–450)
RBC: 3.3 x10E6/uL — ABNORMAL LOW (ref 3.77–5.28)
RDW: 12.1 % (ref 11.7–15.4)
WBC: 8.9 10*3/uL (ref 3.4–10.8)

## 2020-01-09 LAB — RPR: RPR Ser Ql: NONREACTIVE

## 2020-01-09 LAB — GLUCOSE TOLERANCE, 2 HOURS W/ 1HR
Glucose, 1 hour: 121 mg/dL (ref 65–179)
Glucose, 2 hour: 105 mg/dL (ref 65–152)
Glucose, Fasting: 69 mg/dL (ref 65–91)

## 2020-01-09 LAB — ANTIBODY SCREEN: Antibody Screen: NEGATIVE

## 2020-01-12 ENCOUNTER — Other Ambulatory Visit: Payer: Self-pay | Admitting: Women's Health

## 2020-01-12 DIAGNOSIS — Z3480 Encounter for supervision of other normal pregnancy, unspecified trimester: Secondary | ICD-10-CM

## 2020-01-12 MED ORDER — FERROUS SULFATE 325 (65 FE) MG PO TABS: 325.0000 mg | ORAL_TABLET | Freq: Two times a day (BID) | ORAL | 3 refills | Status: DC

## 2020-01-16 ENCOUNTER — Other Ambulatory Visit: Payer: Self-pay | Admitting: Obstetrics & Gynecology

## 2020-01-26 ENCOUNTER — Encounter: Payer: Self-pay | Admitting: Women's Health

## 2020-01-26 ENCOUNTER — Ambulatory Visit (INDEPENDENT_AMBULATORY_CARE_PROVIDER_SITE_OTHER): Payer: BC Managed Care – PPO | Admitting: Women's Health

## 2020-01-26 VITALS — BP 129/78 | HR 117 | Wt 197.4 lb

## 2020-01-26 DIAGNOSIS — Z1389 Encounter for screening for other disorder: Secondary | ICD-10-CM

## 2020-01-26 DIAGNOSIS — Z331 Pregnant state, incidental: Secondary | ICD-10-CM

## 2020-01-26 DIAGNOSIS — Z3A30 30 weeks gestation of pregnancy: Secondary | ICD-10-CM

## 2020-01-26 DIAGNOSIS — Z3480 Encounter for supervision of other normal pregnancy, unspecified trimester: Secondary | ICD-10-CM

## 2020-01-26 DIAGNOSIS — Z3483 Encounter for supervision of other normal pregnancy, third trimester: Secondary | ICD-10-CM

## 2020-01-26 DIAGNOSIS — Z23 Encounter for immunization: Secondary | ICD-10-CM | POA: Diagnosis not present

## 2020-01-26 LAB — POCT URINALYSIS DIPSTICK OB
Glucose, UA: NEGATIVE
Ketones, UA: NEGATIVE
Leukocytes, UA: NEGATIVE
Nitrite, UA: NEGATIVE
POC,PROTEIN,UA: NEGATIVE

## 2020-01-26 NOTE — Progress Notes (Signed)
LOW-RISK PREGNANCY VISIT Patient name: Sherry [name] MRN [id number]  Date of birth: [birth year] Chief Complaint:   Routine Prenatal Visit  History of Present Illness:   Sherry Sherry [name] is a 39 y.o. [id number] female at [redacted]w[redacted]d with an Estimated Date of Delivery: 04/03/20 being seen today for ongoing management of a low-risk pregnancy.  Depression screen [facility] 2/9 09/17/2019 03/04/2019 02/18/2019  Decreased Interest 2 0 1  Down, Depressed, Hopeless 1 0 1  PHQ - 2 Score 3 0 2  Altered sleeping 0 - 3  Tired, decreased energy 3 - 3  Change in appetite 0 - 0  Feeling bad or failure about yourself  1 - 3  Trouble concentrating 0 - 2  Moving slowly or fidgety/restless 0 - 0  Suicidal thoughts 0 - 0  PHQ-9 Score 7 - 13    Today she reports swelling in legs. Contractions: Not present. Vag. Bleeding: None.  Movement: Present. denies leaking of fluid. Review of Systems:   Pertinent items are noted in HPI Denies abnormal vaginal discharge w/ itching/odor/irritation, headaches, visual changes, shortness of breath, chest pain, abdominal pain, severe nausea/vomiting, or problems with urination or bowel movements unless otherwise stated above. Pertinent History Reviewed:  Reviewed past medical,surgical, social, obstetrical and family history.  Reviewed problem list, medications and allergies. Physical Assessment:   Vitals:   01/26/20 1006  BP: 129/78  Pulse: (!) 117  Weight: 197 lb 6.4 oz (89.5 kg)  Body mass index is 34.97 kg/m.        Physical Examination:   General appearance: Well appearing, and in no distress  Mental status: Alert, oriented to person, place, and time  Skin: Warm & dry  Cardiovascular: Normal heart rate noted  Respiratory: Normal respiratory effort, no distress  Abdomen: Soft, gravid, nontender  Pelvic: Cervical exam deferred         Extremities: Edema: Trace  Fetal Status: Fetal Heart Rate (bpm): 144 Fundal Height: 32 cm Movement: Present    Chaperone: N/A   Results for  orders placed or performed in visit on 01/26/20 (from the past 24 hour(s))  POC Urinalysis Dipstick OB   Collection Time: 01/26/20 10:04 AM  Result Value Ref Range   Color, UA     Clarity, UA     Glucose, UA Negative Negative   Bilirubin, UA     Ketones, UA neg    Spec Grav, UA     Blood, UA large    pH, UA     POC,PROTEIN,UA Negative Negative, Trace, Small (1+), Moderate (2+), Large (3+), 4+   Urobilinogen, UA     Nitrite, UA neg    Leukocytes, UA Negative Negative   Appearance     Odor      Assessment & Plan:  1) Low-risk pregnancy [id number] at [redacted]w[redacted]d with an Estimated Date of Delivery: 04/03/20   2) Swelling in legs, elevate, compression stockings if needed   Meds: No orders of the defined types were placed in this encounter.  Labs/procedures today: tdap, declined flu shot  Plan:  Continue routine obstetrical care  Next visit: prefers in person    Reviewed: Preterm labor symptoms and general obstetric precautions including but not limited to vaginal bleeding, contractions, leaking of fluid and fetal movement were reviewed in detail with the patient.  All questions were answered.   Follow-up: Return in about 2 weeks (around 02/09/2020) for [place], [place], in person.  Orders Placed This Encounter  Procedures  . Tdap vaccine greater than or equal  to 7yo IM  . POC Urinalysis Dipstick OB   Roma Schanz CNM, [facility] 01/26/2020 10:25 AM

## 2020-01-26 NOTE — Patient Instructions (Signed)
Sherry [name], I greatly value your feedback.  If you receive a survey following your visit with [place] today, we appreciate you taking the time to fill it out.  Thanks, Knute Neu, CNM, WHNP-BC   [facility] [place] [facility] ([place], [place] [address]) Entrance C, located off of [place] parking  Go to [email or web address] to register for FREE online childbirth classes   Call the office [phone]) or go to [facility] if:  You begin to have strong, frequent contractions  Your water breaks.  Sometimes it is a big gush of fluid, sometimes it is just a trickle that keeps getting your panties wet or running down your legs  You have vaginal bleeding.  It is normal to have a small amount of spotting if your cervix was checked.   You don't feel your baby moving like normal.  If you don't, get you something to eat and drink and lay down and focus on feeling your baby move.  You should feel at least 10 movements in 2 hours.  If you don't, you should call the office or go to [facility].    Tdap Vaccine  It is recommended that you get the Tdap vaccine during the third trimester of EACH pregnancy to help protect your baby from getting pertussis (whooping cough)  27-36 weeks is the BEST time to do this so that you can pass the protection on to your baby. During pregnancy is better than after pregnancy, but if you are unable to get it during pregnancy it will be offered at the hospital.   You can get this vaccine with [place], at the health department, your family doctor, or some local pharmacies  Everyone who will be around your baby should also be up-to-date on their vaccines before the baby comes. Adults (who are not pregnant) only need 1 dose of Tdap during adulthood.   [place] Pediatricians/Family Doctors:  [facility] [phone]                 [place]  [phone] (usually not accepting new patients unless you have family there already, you are always welcome to call and ask)       [facility] Department [phone]       [facility] Pediatricians/Family Doctors:   [facility]: [phone]  [facility]: [phone]  [facility]: [place] Doctors:   [facility]: [facility]: [place]:  [place]: [phone]   Home Blood Pressure Monitoring for Patients   Your provider has recommended that you check your blood pressure (BP) at least once a week at home. If you do not have a blood pressure cuff at home, one will be provided for you. Contact your provider if you have not received your monitor within 1 week.   Helpful Tips for Accurate Home Blood Pressure Checks  . Don't smoke, exercise, or drink caffeine 30 minutes before checking your BP . Use the restroom before checking your BP (a full bladder can raise your pressure) . Relax in a comfortable upright chair . Feet on the ground . Left arm resting comfortably on a flat surface at the level of your heart . Legs uncrossed . Back supported . Sit quietly and don't talk . Place the cuff on your  bare arm . Adjust snuggly, so that only two fingertips can fit between your skin and the top of the cuff . Check 2 readings separated by at least one minute . Keep a log of your BP readings . For a visual, please reference this diagram: http://ccnc.care/bpdiagram  Provider Name: [facility]     Phone: [phone]  Zone 1: ALL CLEAR  Continue to monitor your symptoms:  . BP reading is less than 140 (top number) or less than 90 (bottom number)  . No right upper stomach pain . No headaches or seeing spots . No feeling nauseated or throwing up . No swelling in face and hands  Zone 2: CAUTION Call your  doctor's office for any of the following:  . BP reading is greater than 140 (top number) or greater than 90 (bottom number)  . Stomach pain under your ribs in the middle or right side . Headaches or seeing spots . Feeling nauseated or throwing up . Swelling in face and hands  Zone 3: EMERGENCY  Seek immediate medical care if you have any of the following:  . BP reading is greater than160 (top number) or greater than 110 (bottom number) . Severe headaches not improving with Tylenol . Serious difficulty catching your breath . Any worsening symptoms from Zone 2   Third Trimester of Pregnancy The third trimester is from week 29 through week 42, months 7 through 9. The third trimester is a time when the fetus is growing rapidly. At the end of the ninth month, the fetus is about 20 inches in length and weighs 6-10 pounds.  BODY CHANGES Your body goes through many changes during pregnancy. The changes vary from woman to woman.   Your weight will continue to increase. You can expect to gain 25-35 pounds (11-16 kg) by the end of the pregnancy.  You may begin to get stretch marks on your hips, abdomen, and breasts.  You may urinate more often because the fetus is moving lower into your pelvis and pressing on your bladder.  You may develop or continue to have heartburn as a result of your pregnancy.  You may develop constipation because certain hormones are causing the muscles that push waste through your intestines to slow down.  You may develop hemorrhoids or swollen, bulging veins (varicose veins).  You may have pelvic pain because of the weight gain and pregnancy hormones relaxing your joints between the bones in your pelvis. Backaches may result from overexertion of the muscles supporting your posture.  You may have changes in your hair. These can include thickening of your hair, rapid growth, and changes in texture. Some women also have hair loss during or after pregnancy, or hair that  feels dry or thin. Your hair will most likely return to normal after your baby is born.  Your breasts will continue to grow and be tender. A yellow discharge may leak from your breasts called colostrum.  Your belly button may stick out.  You may feel short of breath because of your expanding uterus.  You may notice the fetus "dropping," or moving lower in your abdomen.  You may have a bloody mucus discharge. This usually occurs a few days to a week before labor begins.  Your cervix becomes thin and soft (effaced) near your due date. WHAT TO EXPECT AT YOUR PRENATAL EXAMS  You will have prenatal exams every 2 weeks until week 36. Then, you will have weekly prenatal exams. During a routine prenatal visit:  You will be weighed to make sure you and the fetus are growing normally.  Your blood pressure is taken.  Your abdomen will be measured to track your baby's growth.  The fetal heartbeat will be listened to.  Any test results from the previous visit will be discussed.  You may have a cervical check near your due date to see if you have effaced. At around 36 weeks, your caregiver will check your cervix. At the same time, your caregiver will also perform a test on the secretions of the vaginal tissue. This test is to determine if a type of bacteria, Group B streptococcus, is present. Your caregiver will explain this further. Your caregiver may ask you:  What your birth plan is.  How you are feeling.  If you are feeling the baby move.  If you have had any abnormal symptoms, such as leaking fluid, bleeding, severe headaches, or abdominal cramping.  If you have any questions. Other tests or screenings that may be performed during your third trimester include:  Blood tests that check for low iron levels (anemia).  Fetal testing to check the health, activity level, and growth of the fetus. Testing is done if you have certain medical conditions or if there are problems during the  pregnancy. FALSE LABOR You may feel small, irregular contractions that eventually go away. These are called Braxton Hicks contractions, or false labor. Contractions may last for hours, days, or even weeks before true labor sets in. If contractions come at regular intervals, intensify, or become painful, it is best to be seen by your caregiver.  SIGNS OF LABOR   Menstrual-like cramps.  Contractions that are 5 minutes apart or less.  Contractions that start on the top of the uterus and spread down to the lower abdomen and back.  A sense of increased pelvic pressure or back pain.  A watery or bloody mucus discharge that comes from the vagina. If you have any of these signs before the 37th week of pregnancy, call your caregiver right away. You need to go to the hospital to get checked immediately. HOME CARE INSTRUCTIONS   Avoid all smoking, herbs, alcohol, and unprescribed drugs. These chemicals affect the formation and growth of the baby.  Follow your caregiver's instructions regarding medicine use. There are medicines that are either safe or unsafe to take during pregnancy.  Exercise only as directed by your caregiver. Experiencing uterine cramps is a good sign to stop exercising.  Continue to eat regular, healthy meals.  Wear a good support bra for breast tenderness.  Do not use hot tubs, steam rooms, or saunas.  Wear your seat belt at all times when driving.  Avoid raw meat, uncooked cheese, cat litter boxes, and soil used by cats. These carry germs that can cause birth defects in the baby.  Take your prenatal vitamins.  Try taking a stool softener (if your caregiver approves) if you develop constipation. Eat more high-fiber foods, such as fresh vegetables or fruit and whole grains. Drink plenty of fluids to keep your urine clear or pale yellow.  Take warm sitz baths to soothe any pain or discomfort caused by hemorrhoids. Use hemorrhoid cream if your caregiver approves.  If you  develop varicose veins, wear support hose. Elevate your feet for 15 minutes, 3-4 times a day. Limit salt in your diet.  Avoid heavy lifting, wear low heal shoes, and practice good posture.  Rest a lot with your legs elevated if you have leg cramps or low  back pain.  Visit your dentist if you have not gone during your pregnancy. Use a soft toothbrush to brush your teeth and be gentle when you floss.  A sexual relationship may be continued unless your caregiver directs you otherwise.  Do not travel far distances unless it is absolutely necessary and only with the approval of your caregiver.  Take prenatal classes to understand, practice, and ask questions about the labor and delivery.  Make a trial run to the hospital.  Pack your hospital bag.  Prepare the baby's nursery.  Continue to go to all your prenatal visits as directed by your caregiver. SEEK MEDICAL CARE IF:  You are unsure if you are in labor or if your water has broken.  You have dizziness.  You have mild pelvic cramps, pelvic pressure, or nagging pain in your abdominal area.  You have persistent nausea, vomiting, or diarrhea.  You have a bad smelling vaginal discharge.  You have pain with urination. SEEK IMMEDIATE MEDICAL CARE IF:   You have a fever.  You are leaking fluid from your vagina.  You have spotting or bleeding from your vagina.  You have severe abdominal cramping or pain.  You have rapid weight loss or gain.  You have shortness of breath with chest pain.  You notice sudden or extreme swelling of your face, hands, ankles, feet, or legs.  You have not felt your baby move in over an hour.  You have severe headaches that do not go away with medicine.  You have vision changes. Document Released: 02/28/2001 Document Revised: 03/11/2013 Document Reviewed: 05/07/2012 [facility] Patient Information 2015 [place], [place]. This information is not intended to replace advice given to you by your health  care provider. Make sure you discuss any questions you have with your health care provider.

## 2020-01-28 ENCOUNTER — Encounter: Payer: Self-pay | Admitting: Women's Health

## 2020-01-28 ENCOUNTER — Ambulatory Visit (INDEPENDENT_AMBULATORY_CARE_PROVIDER_SITE_OTHER): Payer: BC Managed Care – PPO | Admitting: *Deleted

## 2020-01-28 VITALS — BP 116/70 | HR 84

## 2020-01-28 DIAGNOSIS — Z331 Pregnant state, incidental: Secondary | ICD-10-CM

## 2020-01-28 DIAGNOSIS — R030 Elevated blood-pressure reading, without diagnosis of hypertension: Secondary | ICD-10-CM

## 2020-01-28 DIAGNOSIS — Z3480 Encounter for supervision of other normal pregnancy, unspecified trimester: Secondary | ICD-10-CM

## 2020-01-28 DIAGNOSIS — Z3A30 30 weeks gestation of pregnancy: Secondary | ICD-10-CM

## 2020-01-28 DIAGNOSIS — R1111 Vomiting without nausea: Secondary | ICD-10-CM

## 2020-01-28 LAB — POCT URINALYSIS DIPSTICK OB
Glucose, UA: NEGATIVE
Ketones, UA: NEGATIVE
Leukocytes, UA: NEGATIVE
Nitrite, UA: NEGATIVE

## 2020-01-28 NOTE — Progress Notes (Addendum)
   NURSE VISIT- BLOOD PRESSURE CHECK  SUBJECTIVE:  Sherry [name] is a 39 y.o. [id number] female here for BP check. She is [redacted]w[redacted]d pregnant    HYPERTENSION ROS:  Pregnant/postpartum:  . Severe headaches that don't go away with tylenol/other medicines: makes headaches less severe but doesn't go away. . Visual changes (seeing spots/double/blurred vision) No  . Severe pain under right breast breast or in center of upper chest No  . Severe nausea/vomiting Yes  nausea . Taking medicines as instructed yes   OBJECTIVE:  BP 127/83 (BP Location: Right Arm, Patient Position: Sitting, Cuff Size: Normal)   Pulse 84   LMP 06/28/2019 (Exact Date)   Appearance alert, well appearing, and in no distress.  ASSESSMENT: Pregnancy [redacted]w[redacted]d  blood pressure check  PLAN: Discussed with Sherry [name], CNM, [facility]   Recommendations: pt given new bp cuff, it had similar results as our machine. Advised to check at home once a day and let us know if any readings of 140's/90's or greater.   Follow-up: as scheduled   Janece Canterbury  01/28/2020 11:35 AM   Chart reviewed for nurse visit. Agree with plan of care.  Roma Schanz, [place] 01/28/2020 12:45 PM

## 2020-01-30 LAB — URINE CULTURE

## 2020-02-09 ENCOUNTER — Ambulatory Visit (INDEPENDENT_AMBULATORY_CARE_PROVIDER_SITE_OTHER): Payer: BC Managed Care – PPO | Admitting: Advanced Practice Midwife

## 2020-02-09 ENCOUNTER — Encounter: Payer: Self-pay | Admitting: Advanced Practice Midwife

## 2020-02-09 ENCOUNTER — Other Ambulatory Visit: Payer: Self-pay

## 2020-02-09 VITALS — BP 126/87 | HR 108 | Wt 199.5 lb

## 2020-02-09 DIAGNOSIS — Z1389 Encounter for screening for other disorder: Secondary | ICD-10-CM

## 2020-02-09 DIAGNOSIS — Z3480 Encounter for supervision of other normal pregnancy, unspecified trimester: Secondary | ICD-10-CM

## 2020-02-09 DIAGNOSIS — R801 Persistent proteinuria, unspecified: Secondary | ICD-10-CM

## 2020-02-09 DIAGNOSIS — Z331 Pregnant state, incidental: Secondary | ICD-10-CM

## 2020-02-09 DIAGNOSIS — O2343 Unspecified infection of urinary tract in pregnancy, third trimester: Secondary | ICD-10-CM

## 2020-02-09 DIAGNOSIS — Z3A32 32 weeks gestation of pregnancy: Secondary | ICD-10-CM

## 2020-02-09 LAB — POCT URINALYSIS DIPSTICK OB
Glucose, UA: NEGATIVE
Ketones, UA: NEGATIVE
Nitrite, UA: NEGATIVE

## 2020-02-09 NOTE — Progress Notes (Signed)
LOW-RISK PREGNANCY VISIT Patient name: Sherry [name] MRN [id number]  Date of birth: [birth year] Chief Complaint:   Routine Prenatal Visit (swelling in feet, ankles and calves)  History of Present Illness:   Sherry SADEEN WIEGEL is a 39 y.o. [id number] female at [redacted]w[redacted]d with an Estimated Date of Delivery: 04/03/20 being seen today for ongoing management of a low-risk pregnancy.  Today she reports swelling in feel, ankles and calves. BPs at home all normal. Contractions: Irregular. Vag. Bleeding: None.  Movement: Present. denies leaking of fluid. Review of Systems:   Pertinent items are noted in HPI Denies abnormal vaginal discharge w/ itching/odor/irritation, headaches, visual changes, shortness of breath, chest pain, abdominal pain, severe nausea/vomiting, or problems with urination or bowel movements unless otherwise stated above. Pertinent History Reviewed:  Reviewed past medical,surgical, social, obstetrical and family history.  Reviewed problem list, medications and allergies. Physical Assessment:   Vitals:   02/09/20 1000  BP: 126/87  Pulse: (!) 108  Weight: 199 lb 8 oz (90.5 kg)  Body mass index is 35.34 kg/m.        Physical Examination:   General appearance: Well appearing, and in no distress  Mental status: Alert, oriented to person, place, and time  Skin: Warm & dry  Cardiovascular: Normal heart rate noted  Respiratory: Normal respiratory effort, no distress  Abdomen: Soft, gravid, nontender  Pelvic: Cervical exam deferred         Extremities: Edema: Trace  Fetal Status:     Movement: Present    Chaperone: n/a    Results for orders placed or performed in visit on 02/09/20 (from the past 24 hour(s))  POC Urinalysis Dipstick OB   Collection Time: 02/09/20 10:03 AM  Result Value Ref Range   Color, UA     Clarity, UA     Glucose, UA Negative Negative   Bilirubin, UA     Ketones, UA neg    Spec Grav, UA     Blood, UA 3+    pH, UA     POC,PROTEIN,UA Moderate (2+) Negative,  Trace, Small (1+), Moderate (2+), Large (3+), 4+   Urobilinogen, UA     Nitrite, UA neg    Leukocytes, UA Trace (A) Negative   Appearance     Odor      Assessment & Plan:  1) Low-risk pregnancy [id number] at [redacted]w[redacted]d with an Estimated Date of Delivery: 04/03/20   2) pregnancy swelling, tips given  3) persistent hematuria, now w/proteinuria:  Check labs  Meds: No orders of the defined types were placed in this encounter.  Labs/procedures today:  Orders Placed This Encounter  Procedures  . Protein, urine, 24 hour  . Comprehensive metabolic panel    Order Specific Question:   Has the patient fasted?    Answer:   Yes  . CBC  . POC Urinalysis Dipstick OB     Plan:  Continue routine obstetrical care  Next visit: prefers in person    Reviewed: Preterm labor symptoms and general obstetric precautions including but not limited to vaginal bleeding, contractions, leaking of fluid and fetal movement were reviewed in detail with the patient.  All questions were answered. Has home bp cuff.. Check bp weekly, let us know if >140/90.   Follow-up: Return in about 2 weeks (around 02/23/2020) for [place].  Orders Placed This Encounter  Procedures  . Protein, urine, 24 hour  . Comprehensive metabolic panel  . CBC  . POC Urinalysis Dipstick OB   Christin Fudge DNP, CNM 02/09/2020  10:34 AM

## 2020-02-09 NOTE — Patient Instructions (Addendum)
Sherry [name], I greatly value your feedback.  If you receive a survey following your visit with [place] today, we appreciate you taking the time to fill it out.  Thanks, [name], DNP, CNM  [place]!!! It is now [place] at [facility] ([place], [place] [address]) Entrance located off of [place] parking   Go to [email or web address] to register for FREE online childbirth classes    Call the office [phone]) or go to [place] if:  You begin to have strong, frequent contractions  Your water breaks.  Sometimes it is a big gush of fluid, sometimes it is just a trickle that keeps getting your panties wet or running down your legs  You have vaginal bleeding.  It is normal to have a small amount of spotting if your cervix was checked.   You don't feel your baby moving like normal.  If you don't, get you something to eat and drink and lay down and focus on feeling your baby move.  You should feel at least 10 movements in 2 hours.  If you don't, you should call the office or go to [place] Blood Pressure Monitoring for Patients   Your provider has recommended that you check your blood pressure (BP) at least once a week at home. If you do not have a blood pressure cuff at home, one will be provided for you. Contact your provider if you have not received your monitor within 1 week.   Helpful Tips for Accurate Home Blood Pressure Checks  . Don't smoke, exercise, or drink caffeine 30 minutes before checking your BP . Use the restroom before checking your BP (a full bladder can raise your pressure) . Relax in a comfortable upright chair . Feet on the ground . Left arm resting comfortably on a flat surface at the level of your heart . Legs uncrossed . Back supported . Sit quietly and don't talk . Place the cuff on your bare arm . Adjust snuggly, so that only two fingertips can fit  between your skin and the top of the cuff . Check 2 readings separated by at least one minute . Keep a log of your BP readings . For a visual, please reference this diagram: http://ccnc.care/bpdiagram  Provider Name: [facility]     Phone: [phone]  Zone 1: ALL CLEAR  Continue to monitor your symptoms:  . BP reading is less than 140 (top number) or less than 90 (bottom number)  . No right upper stomach pain . No headaches or seeing spots . No feeling nauseated or throwing up . No swelling in face and hands  Zone 2: CAUTION Call your doctor's office for any of the following:  . BP reading is greater than 140 (top number) or greater than 90 (bottom number)  . Stomach pain under your ribs in the middle or right side . Headaches or seeing spots . Feeling nauseated or throwing up . Swelling in face and hands  Zone 3: EMERGENCY  Seek immediate medical care if you have any of the following:  . BP reading is greater than160 (top number) or greater than 110 (bottom number) . Severe headaches not improving with Tylenol . Serious difficulty catching your breath . Any worsening symptoms from Zone 2     Peripheral Edema  Peripheral edema is swelling that is caused by a buildup of fluid. Peripheral edema most  often affects the lower legs, ankles, and feet. It can also develop in the arms, hands, and face. The area of the body that has peripheral edema will look swollen. It may also feel heavy or warm. Your clothes may start to feel tight. Pressing on the area may make a temporary dent in your skin. You may not be able to move your swollen arm or leg as much as usual. There are many causes of peripheral edema. It can happen because of a complication of other conditions such as congestive heart failure, kidney disease, or a problem with your blood circulation. It also can be a side effect of certain medicines or because of an infection. It often happens to women during pregnancy.  Sometimes, the cause is not known. Follow these instructions at home: Managing pain, stiffness, and swelling   Raise (elevate) your legs while you are sitting or lying down.  Move around often to prevent stiffness and to lessen swelling.  Do not sit or stand for long periods of time.  Wear support stockings as told by your health care provider. Medicines  Take over-the-counter and prescription medicines only as told by your health care provider.  Your health care provider may prescribe medicine to help your body get rid of excess water (diuretic). General instructions  Pay attention to any changes in your symptoms.  Follow instructions from your health care provider about limiting salt (sodium) in your diet. Sometimes, eating less salt may reduce swelling.  Moisturize skin daily to help prevent skin from cracking and draining.  Keep all follow-up visits as told by your health care provider. This is important. Contact a health care provider if you have:  A fever.  Edema that starts suddenly or is getting worse, especially if you are pregnant or have a medical condition.  Swelling in only one leg.  Increased swelling, redness, or pain in one or both of your legs.  Drainage or sores at the area where you have edema. Get help right away if you:  Develop shortness of breath, especially when you are lying down.  Have pain in your chest or abdomen.  Feel weak.  Feel faint. Summary  Peripheral edema is swelling that is caused by a buildup of fluid. Peripheral edema most often affects the lower legs, ankles, and feet.  Move around often to prevent stiffness and to lessen swelling. Do not sit or stand for long periods of time.  Pay attention to any changes in your symptoms.  Contact a health care provider if you have edema that starts suddenly or is getting worse, especially if you are pregnant or have a medical condition.  Get help right away if you develop shortness of  breath, especially when lying down. This information is not intended to replace advice given to you by your health care provider. Make sure you discuss any questions you have with your health care provider. Document Revised: 11/28/2017 Document Reviewed: 11/28/2017 Elsevier Patient Education  [place].

## 2020-02-10 ENCOUNTER — Other Ambulatory Visit: Payer: Self-pay | Admitting: Women's Health

## 2020-02-10 LAB — CBC
Hematocrit: 30.5 % — ABNORMAL LOW (ref 34.0–46.6)
Hemoglobin: 10.3 g/dL — ABNORMAL LOW (ref 11.1–15.9)
MCH: 31.1 pg (ref 26.6–33.0)
MCHC: 33.8 g/dL (ref 31.5–35.7)
MCV: 92 fL (ref 79–97)
Platelets: 219 10*3/uL (ref 150–450)
RBC: 3.31 x10E6/uL — ABNORMAL LOW (ref 3.77–5.28)
RDW: 12.4 % (ref 11.7–15.4)
WBC: 11 10*3/uL — ABNORMAL HIGH (ref 3.4–10.8)

## 2020-02-10 LAB — COMPREHENSIVE METABOLIC PANEL
ALT: 12 IU/L (ref 0–32)
AST: 17 IU/L (ref 0–40)
Albumin/Globulin Ratio: 1.2 (ref 1.2–2.2)
Albumin: 3.7 g/dL — ABNORMAL LOW (ref 3.8–4.8)
Alkaline Phosphatase: 111 IU/L (ref 44–121)
BUN/Creatinine Ratio: 8 — ABNORMAL LOW (ref 9–23)
BUN: 4 mg/dL — ABNORMAL LOW (ref 6–20)
Bilirubin Total: 0.3 mg/dL (ref 0.0–1.2)
CO2: 19 mmol/L — ABNORMAL LOW (ref 20–29)
Calcium: 9.4 mg/dL (ref 8.7–10.2)
Chloride: 104 mmol/L (ref 96–106)
Creatinine, Ser: 0.51 mg/dL — ABNORMAL LOW (ref 0.57–1.00)
GFR calc Af Amer: 141 mL/min/{1.73_m2} (ref >59)
GFR calc non Af Amer: 122 mL/min/{1.73_m2} (ref >59)
Globulin, Total: 3 g/dL (ref 1.5–4.5)
Glucose: 70 mg/dL (ref 65–99)
Potassium: 4.3 mmol/L (ref 3.5–5.2)
Sodium: 137 mmol/L (ref 134–144)
Total Protein: 6.7 g/dL (ref 6.0–8.5)

## 2020-02-12 ENCOUNTER — Other Ambulatory Visit: Payer: Self-pay | Admitting: Advanced Practice Midwife

## 2020-02-12 LAB — URINE CULTURE

## 2020-02-12 MED ORDER — NITROFURANTOIN MONOHYD MACRO 100 MG PO CAPS: 100.0000 mg | ORAL_CAPSULE | Freq: Two times a day (BID) | ORAL | 0 refills | Status: DC

## 2020-02-12 NOTE — Progress Notes (Signed)
macrobid for + E coli

## 2020-02-17 LAB — PROTEIN, URINE, 24 HOUR
Protein, 24H Urine: 1207 mg/24 hr — ABNORMAL HIGH (ref 30–150)
Protein, Ur: 100.6 mg/dL

## 2020-02-23 ENCOUNTER — Encounter: Payer: BC Managed Care – PPO | Admitting: Obstetrics & Gynecology

## 2020-02-24 ENCOUNTER — Encounter: Payer: Self-pay | Admitting: Obstetrics & Gynecology

## 2020-02-24 ENCOUNTER — Encounter: Payer: BC Managed Care – PPO | Admitting: Obstetrics & Gynecology

## 2020-02-24 ENCOUNTER — Other Ambulatory Visit: Payer: Self-pay

## 2020-02-24 ENCOUNTER — Ambulatory Visit (INDEPENDENT_AMBULATORY_CARE_PROVIDER_SITE_OTHER): Payer: BC Managed Care – PPO | Admitting: Obstetrics & Gynecology

## 2020-02-24 VITALS — BP 140/85 | HR 111 | Wt 200.0 lb

## 2020-02-24 DIAGNOSIS — Z3480 Encounter for supervision of other normal pregnancy, unspecified trimester: Secondary | ICD-10-CM

## 2020-02-24 DIAGNOSIS — Z1389 Encounter for screening for other disorder: Secondary | ICD-10-CM

## 2020-02-24 DIAGNOSIS — Z331 Pregnant state, incidental: Secondary | ICD-10-CM

## 2020-02-24 DIAGNOSIS — R319 Hematuria, unspecified: Secondary | ICD-10-CM

## 2020-02-24 DIAGNOSIS — Z3A34 34 weeks gestation of pregnancy: Secondary | ICD-10-CM

## 2020-02-24 LAB — POCT URINALYSIS DIPSTICK OB
Glucose, UA: NEGATIVE
Ketones, UA: NEGATIVE
Nitrite, UA: NEGATIVE

## 2020-02-24 NOTE — Progress Notes (Signed)
[place] PREGNANCY VISIT Patient name: Sherry [name] MRN [id number]  Date of birth: [birth year] Chief Complaint:   Routine Prenatal Visit (swelling in ankles and feet)  History of Present Illness:   Sherry [name] is a 39 y.o. [id number] female at [redacted]w[redacted]d with an Estimated Date of Delivery: 04/03/20 being seen today for ongoing management of a high-risk pregnancy complicated by advanced maternal age and increasing proteinuria.  Today she reports no complaints.  Depression screen [facility] 2/9 09/17/2019 03/04/2019 02/18/2019  Decreased Interest 2 0 1  Down, Depressed, Hopeless 1 0 1  PHQ - 2 Score 3 0 2  Altered sleeping 0 - 3  Tired, decreased energy 3 - 3  Change in appetite 0 - 0  Feeling bad or failure about yourself  1 - 3  Trouble concentrating 0 - 2  Moving slowly or fidgety/restless 0 - 0  Suicidal thoughts 0 - 0  PHQ-9 Score 7 - 13    Contractions: Irregular. Vag. Bleeding: None.  Movement: Present. denies leaking of fluid.  Review of Systems:   Pertinent items are noted in HPI Denies abnormal vaginal discharge w/ itching/odor/irritation, headaches, visual changes, shortness of breath, chest pain, abdominal pain, severe nausea/vomiting, or problems with urination or bowel movements unless otherwise stated above. Pertinent History Reviewed:  Reviewed past medical,surgical, social, obstetrical and family history.  Reviewed problem list, medications and allergies. Physical Assessment:   Vitals:   02/24/20 1606  BP: 140/85  Pulse: (!) 111  Weight: 200 lb (90.7 kg)  Body mass index is 35.43 kg/m.           Physical Examination:   General appearance: alert, well appearing, and in no distress  Mental status: alert, oriented to person, place, and time  Skin: warm & dry   Extremities: Edema: Trace    Cardiovascular: normal heart rate noted  Respiratory: normal respiratory effort, no distress  Abdomen: gravid, soft, non-tender  Pelvic: Cervical exam deferred         Fetal Status:      Movement: Present    Fetal Surveillance Testing today: FHR 138   Chaperone: N/A    Results for orders placed or performed in visit on 02/24/20 (from the past 24 hour(s))  POC Urinalysis Dipstick OB   Collection Time: 02/24/20  4:08 PM  Result Value Ref Range   Color, UA     Clarity, UA     Glucose, UA Negative Negative   Bilirubin, UA     Ketones, UA neg    Spec Grav, UA     Blood, UA 3+    pH, UA     POC,PROTEIN,UA 4+ Negative, Trace, Small (1+), Moderate (2+), Large (3+), 4+   Urobilinogen, UA     Nitrite, UA neg    Leukocytes, UA Moderate (2+) (A) Negative   Appearance     Odor      Assessment & Plan:  High-risk pregnancy: [id number] at [redacted]w[redacted]d with an Estimated Date of Delivery: 04/03/20   1) Evolving pre eclampsia clinical picture, begin twice weekly surveillance in 3 days, steroids if it appears delivery prior to 37 weeks  2) AMA,   Meds: No orders of the defined types were placed in this encounter.   Labs/procedures today:   Treatment Plan:  NST  Reviewed: Preterm labor symptoms and general obstetric precautions including but not limited to vaginal bleeding, contractions, leaking of fluid and fetal movement were reviewed in detail with the patient.  All questions were answered. [place] bp  cuff. Rx faxed to . Check bp weekly, let us know if >140/90.   Follow-up: Return in about 3 days (around 02/27/2020) for HROB, NST.   No future appointments.  Orders Placed This Encounter  Procedures  . Urine Culture   Florian Buff  02/24/2020 4:36 PM

## 2020-02-27 ENCOUNTER — Ambulatory Visit (INDEPENDENT_AMBULATORY_CARE_PROVIDER_SITE_OTHER): Payer: BC Managed Care – PPO | Admitting: *Deleted

## 2020-02-27 ENCOUNTER — Other Ambulatory Visit: Payer: Self-pay

## 2020-02-27 VITALS — BP 147/89 | HR 119

## 2020-02-27 DIAGNOSIS — Z3480 Encounter for supervision of other normal pregnancy, unspecified trimester: Secondary | ICD-10-CM

## 2020-02-27 DIAGNOSIS — O09523 Supervision of elderly multigravida, third trimester: Secondary | ICD-10-CM

## 2020-02-27 DIAGNOSIS — Z3A34 34 weeks gestation of pregnancy: Secondary | ICD-10-CM

## 2020-02-27 DIAGNOSIS — Z331 Pregnant state, incidental: Secondary | ICD-10-CM

## 2020-02-27 DIAGNOSIS — Z1389 Encounter for screening for other disorder: Secondary | ICD-10-CM

## 2020-02-27 LAB — POCT URINALYSIS DIPSTICK OB
Blood, UA: NEGATIVE
Glucose, UA: NEGATIVE
Ketones, UA: NEGATIVE
Leukocytes, UA: NEGATIVE
Nitrite, UA: NEGATIVE

## 2020-02-27 LAB — URINE CULTURE

## 2020-02-27 NOTE — Progress Notes (Addendum)
   NURSE VISIT- NST  SUBJECTIVE:  Sherry [name] is a 39 y.o. [phone] female at [redacted]w[redacted]d, here for a NST for pregnancy complicated by AMA.  She reports active fetal movement, contractions: none, vaginal bleeding: none, membranes: intact.   OBJECTIVE:  BP (!) 147/89   Pulse (!) 119   LMP 06/28/2019 (Exact Date)   Appears well, no apparent distress  Results for orders placed or performed in visit on 02/27/20 (from the past 24 hour(s))  POC Urinalysis Dipstick OB   Collection Time: 02/27/20 11:01 AM  Result Value Ref Range   Color, UA     Clarity, UA     Glucose, UA Negative Negative   Bilirubin, UA     Ketones, UA neg    Spec Grav, UA     Blood, UA neg    pH, UA     POC,PROTEIN,UA Moderate (2+) Negative, Trace, Small (1+), Moderate (2+), Large (3+), 4+   Urobilinogen, UA     Nitrite, UA neg    Leukocytes, UA Negative Negative   Appearance     Odor      NST: FHR baseline 130 bpm, Variability: moderate, Accelerations:present, Decelerations:  Absent= Cat 1 /Reactive Toco: none   ASSESSMENT: [id number] at [redacted]w[redacted]d with AMA NST reactive  PLAN: EFM strip reviewed by Dr. Elonda Husky   Recommendations: keep next appointment as scheduled    Alice Rieger  02/27/2020 1:43 PM Attestation of Attending Supervision of Nursing Visit Encounter: Evaluation and management procedures were performed by the nursing staff under my supervision and collaboration.  I have reviewed the nurse's note and chart, and I agree with the management and plan.  Jacelyn Grip MD Attending Physician for the Center for [facility] 03/01/2020 8:01 AM

## 2020-03-02 ENCOUNTER — Other Ambulatory Visit: Payer: Self-pay

## 2020-03-02 ENCOUNTER — Encounter: Payer: Self-pay | Admitting: Obstetrics & Gynecology

## 2020-03-02 ENCOUNTER — Ambulatory Visit (INDEPENDENT_AMBULATORY_CARE_PROVIDER_SITE_OTHER): Payer: BC Managed Care – PPO | Admitting: Obstetrics & Gynecology

## 2020-03-02 VITALS — BP 143/94 | HR 116 | Wt 197.0 lb

## 2020-03-02 DIAGNOSIS — Z1389 Encounter for screening for other disorder: Secondary | ICD-10-CM

## 2020-03-02 DIAGNOSIS — Z3480 Encounter for supervision of other normal pregnancy, unspecified trimester: Secondary | ICD-10-CM

## 2020-03-02 DIAGNOSIS — Z331 Pregnant state, incidental: Secondary | ICD-10-CM

## 2020-03-02 DIAGNOSIS — O288 Other abnormal findings on antenatal screening of mother: Secondary | ICD-10-CM

## 2020-03-02 LAB — POCT URINALYSIS DIPSTICK OB
Blood, UA: 3
Glucose, UA: NEGATIVE
Ketones, UA: NEGATIVE
Leukocytes, UA: NEGATIVE
Nitrite, UA: NEGATIVE

## 2020-03-02 MED ORDER — BUTALBITAL-APAP-CAFFEINE 50-325-40 MG PO TABS: 1.0000 | ORAL_TABLET | Freq: Four times a day (QID) | ORAL | 0 refills | Status: DC | PRN

## 2020-03-02 NOTE — Progress Notes (Signed)
HIGH-RISK PREGNANCY VISIT Patient name: Sherry [name] MRN [id number]  Date of birth: 1980/06/16 Chief Complaint:   Non-stress Test (Took Fioricet around 1am no relieved. Took tylenol 500mg  twice at different time, head still hurt.)  History of Present Illness:   Sherry [name] is a 39 y.o. [phone] female at [redacted]w[redacted]d with an Estimated Date of Delivery: 04/03/20 being seen today for ongoing management of a high-risk pregnancy complicated by mild pre eclampsia.  Today she reports headache which has eased with tylenol fioricet not quite resolved, she has suffered with headaches throughout the pregnancy no visual changes.  Depression screen [facility] 2/9 09/17/2019 03/04/2019 02/18/2019  Decreased Interest 2 0 1  Down, Depressed, Hopeless 1 0 1  PHQ - 2 Score 3 0 2  Altered sleeping 0 - 3  Tired, decreased energy 3 - 3  Change in appetite 0 - 0  Feeling bad or failure about yourself  1 - 3  Trouble concentrating 0 - 2  Moving slowly or fidgety/restless 0 - 0  Suicidal thoughts 0 - 0  PHQ-9 Score 7 - 13    Contractions: Irregular.  .  Movement: Present. denies leaking of fluid.  Review of Systems:   Pertinent items are noted in HPI Denies abnormal vaginal discharge w/ itching/odor/irritation, headaches, visual changes, shortness of breath, chest pain, abdominal pain, severe nausea/vomiting, or problems with urination or bowel movements unless otherwise stated above. Pertinent History Reviewed:  Reviewed past medical,surgical, social, obstetrical and family history.  Reviewed problem list, medications and allergies. Physical Assessment:   Vitals:   03/02/20 1050  BP: (!) 143/94  Pulse: (!) 116  Weight: 197 lb (89.4 kg)  Body mass index is 34.9 kg/m.           Physical Examination:   General appearance: alert, well appearing, and in no distress  Mental status: alert, oriented to person, place, and time  Skin: warm & dry   Extremities: Edema: Trace    Cardiovascular: normal heart rate  noted  Respiratory: normal respiratory effort, no distress  Abdomen: gravid, soft, non-tender  Pelvic: Cervical exam deferred         Fetal Status:     Movement: Present    Fetal Surveillance Testing today: Reactive NST   Chaperone: N/A    Results for orders placed or performed in visit on 03/02/20 (from the past 24 hour(s))  POC Urinalysis Dipstick OB   Collection Time: 03/02/20 11:03 AM  Result Value Ref Range   Color, UA     Clarity, UA     Glucose, UA Negative Negative   Bilirubin, UA     Ketones, UA neg    Spec Grav, UA     Blood, UA 3    pH, UA     POC,PROTEIN,UA Moderate (2+) Negative, Trace, Small (1+), Moderate (2+), Large (3+), 4+   Urobilinogen, UA     Nitrite, UA neg    Leukocytes, UA Negative Negative   Appearance     Odor      Assessment & Plan:  High-risk pregnancy: [id number] at [redacted]w[redacted]d with an Estimated Date of Delivery: 04/03/20   1) Mild pre eclampsia, IOL 12/26, [redacted]w[redacted]d  2) AMA,   Meds:  Meds ordered this encounter  Medications   butalbital-acetaminophen-caffeine (FIORICET) 50-325-40 MG tablet    Sig: Take 1-2 tablets by mouth every 6 (six) hours as needed for headache.    Dispense:  20 tablet    Refill:  0    Labs/procedures today:  Reactive NST  Sherry [name] is at [redacted]w[redacted]d Estimated Date of Delivery: 04/03/20  NST being performed due to mild pre eclampsia  Today the NST is Reactive  Fetal Monitoring:  Baseline: 135 bpm, Variability: Good {> 6 bpm), Accelerations: Reactive and Decelerations: Absent   reactive  The accelerations are >15 bpm and more than 2 in 20 minutes  Final diagnosis:  Reactive NST  Florian Buff, MD     Treatment Plan:  Twice weekly testing alternating IOL 37 weeks  Reviewed: Preterm labor symptoms and general obstetric precautions including but not limited to vaginal bleeding, contractions, leaking of fluid and fetal movement were reviewed in detail with the patient.  All questions were answered. Has home bp cuff. Rx faxed  to . Check bp weekly, let us know if >140/90.   Follow-up: Return in about 3 days (around 03/05/2020) for BPP/sono, HROB.   Future Appointments  Date Time Provider [place]  03/05/2020 10:10 AM [facility] NURSE [facility]  03/09/2020  3:50 PM Roma Schanz, CNM [facility]  03/16/2020  3:30 PM Florian Buff, MD [facility]  03/23/2020  3:30 PM Florian Buff, MD [facility]  03/26/2020 10:50 AM [facility] NURSE [facility]  03/30/2020  3:50 PM Roma Schanz, CNM [facility]  04/02/2020 10:10 AM [facility] NURSE [facility]    Orders Placed This Encounter  Procedures   POC Urinalysis Dipstick OB   Florian Buff CNM, WHNP-BC 03/02/2020 12:03 PM

## 2020-03-03 ENCOUNTER — Other Ambulatory Visit: Payer: Self-pay | Admitting: Obstetrics & Gynecology

## 2020-03-03 DIAGNOSIS — O1413 Severe pre-eclampsia, third trimester: Secondary | ICD-10-CM

## 2020-03-04 ENCOUNTER — Other Ambulatory Visit: Payer: Self-pay

## 2020-03-04 ENCOUNTER — Ambulatory Visit (INDEPENDENT_AMBULATORY_CARE_PROVIDER_SITE_OTHER): Payer: BC Managed Care – PPO

## 2020-03-04 DIAGNOSIS — F17200 Nicotine dependence, unspecified, uncomplicated: Secondary | ICD-10-CM

## 2020-03-04 DIAGNOSIS — O3413 Maternal care for benign tumor of corpus uteri, third trimester: Secondary | ICD-10-CM

## 2020-03-04 DIAGNOSIS — O09523 Supervision of elderly multigravida, third trimester: Secondary | ICD-10-CM | POA: Diagnosis not present

## 2020-03-04 DIAGNOSIS — R8271 Bacteriuria: Secondary | ICD-10-CM

## 2020-03-04 DIAGNOSIS — Z3480 Encounter for supervision of other normal pregnancy, unspecified trimester: Secondary | ICD-10-CM

## 2020-03-04 DIAGNOSIS — F1290 Cannabis use, unspecified, uncomplicated: Secondary | ICD-10-CM

## 2020-03-04 DIAGNOSIS — Z3A35 35 weeks gestation of pregnancy: Secondary | ICD-10-CM

## 2020-03-04 DIAGNOSIS — O1413 Severe pre-eclampsia, third trimester: Secondary | ICD-10-CM | POA: Diagnosis not present

## 2020-03-04 NOTE — Progress Notes (Signed)
[place] 52+4 wks,cephalic,posterior placenta gr 3,AFI 17.5 cm,RI .63,.63=69%,EFW 2206 g 6.8%,BPP 8/8,discussed results with Dr.Eure

## 2020-03-05 ENCOUNTER — Other Ambulatory Visit: Payer: BC Managed Care – PPO

## 2020-03-09 ENCOUNTER — Other Ambulatory Visit: Payer: Self-pay

## 2020-03-09 ENCOUNTER — Other Ambulatory Visit (HOSPITAL_COMMUNITY)
Admission: RE | Admit: 2020-03-09 | Discharge: 2020-03-09 | Disposition: A | Discharge status: Home / Self Care | Payer: BC Managed Care – PPO | Source: Ambulatory Visit | Attending: Obstetrics & Gynecology | Admitting: Obstetrics & Gynecology

## 2020-03-09 ENCOUNTER — Encounter: Payer: Self-pay | Admitting: Women's Health

## 2020-03-09 ENCOUNTER — Ambulatory Visit (INDEPENDENT_AMBULATORY_CARE_PROVIDER_SITE_OTHER): Payer: BC Managed Care – PPO | Admitting: Women's Health

## 2020-03-09 ENCOUNTER — Other Ambulatory Visit: Payer: BC Managed Care – PPO

## 2020-03-09 ENCOUNTER — Other Ambulatory Visit: Payer: BC Managed Care – PPO | Admitting: Women's Health

## 2020-03-09 VITALS — BP 143/94 | HR 109 | Wt 194.0 lb

## 2020-03-09 DIAGNOSIS — F1290 Cannabis use, unspecified, uncomplicated: Secondary | ICD-10-CM

## 2020-03-09 DIAGNOSIS — O09523 Supervision of elderly multigravida, third trimester: Secondary | ICD-10-CM

## 2020-03-09 DIAGNOSIS — Z3A36 36 weeks gestation of pregnancy: Secondary | ICD-10-CM | POA: Diagnosis present

## 2020-03-09 DIAGNOSIS — O3413 Maternal care for benign tumor of corpus uteri, third trimester: Secondary | ICD-10-CM

## 2020-03-09 DIAGNOSIS — O0993 Supervision of high risk pregnancy, unspecified, third trimester: Secondary | ICD-10-CM

## 2020-03-09 DIAGNOSIS — Z331 Pregnant state, incidental: Secondary | ICD-10-CM

## 2020-03-09 DIAGNOSIS — O1490 Unspecified pre-eclampsia, unspecified trimester: Secondary | ICD-10-CM | POA: Insufficient documentation

## 2020-03-09 DIAGNOSIS — F17200 Nicotine dependence, unspecified, uncomplicated: Secondary | ICD-10-CM

## 2020-03-09 DIAGNOSIS — O99891 Other specified diseases and conditions complicating pregnancy: Secondary | ICD-10-CM

## 2020-03-09 DIAGNOSIS — O1493 Unspecified pre-eclampsia, third trimester: Secondary | ICD-10-CM

## 2020-03-09 DIAGNOSIS — D259 Leiomyoma of uterus, unspecified: Secondary | ICD-10-CM

## 2020-03-09 DIAGNOSIS — R8271 Bacteriuria: Secondary | ICD-10-CM

## 2020-03-09 DIAGNOSIS — Z1389 Encounter for screening for other disorder: Secondary | ICD-10-CM

## 2020-03-09 LAB — POCT URINALYSIS DIPSTICK OB
Glucose, UA: NEGATIVE
Ketones, UA: NEGATIVE
Leukocytes, UA: NEGATIVE
Nitrite, UA: NEGATIVE

## 2020-03-09 NOTE — Patient Instructions (Signed)
Sherry [name], I greatly value your feedback.  If you receive a survey following your visit with [place] today, we appreciate you taking the time to fill it out.  Thanks, Knute Neu, CNM, WHNP-BC  [facility] [place] [facility] ([place], [place] [address]) Entrance C, located off of [address]   Go to [email or web address] to register for FREE online childbirth classes    Call the office [phone]) or go to [facility] if:  You begin to have strong, frequent contractions  Your water breaks.  Sometimes it is a big gush of fluid, sometimes it is just a trickle that keeps getting your panties wet or running down your legs  You have vaginal bleeding.  It is normal to have a small amount of spotting if your cervix was checked.   You don't feel your baby moving like normal.  If you don't, get you something to eat and drink and lay down and focus on feeling your baby move.  You should feel at least 10 movements in 2 hours.  If you don't, you should call the office or go to [facility].   Call the office [phone]) or go to [facility] for these signs of pre-eclampsia:  Severe headache that does not go away with Tylenol  Visual changes- seeing spots, double, blurred vision  Pain under your right breast or upper abdomen that does not go away with Tums or heartburn medicine  Nausea and/or vomiting  Severe swelling in your hands, feet, and face    Home Blood Pressure Monitoring for Patients   Your provider has recommended that you check your blood pressure (BP) at least once a week at home. If you do not have a blood pressure cuff at home, one will be provided for you. Contact your provider if you have not received your monitor within 1 week.   Helpful Tips for Accurate Home Blood Pressure Checks  . Don't smoke, exercise, or drink caffeine 30 minutes before checking your BP . Use the restroom before checking your BP (a full bladder  can raise your pressure) . Relax in a comfortable upright chair . Feet on the ground . Left arm resting comfortably on a flat surface at the level of your heart . Legs uncrossed . Back supported . Sit quietly and don't talk . Place the cuff on your bare arm . Adjust snuggly, so that only two fingertips can fit between your skin and the top of the cuff . Check 2 readings separated by at least one minute . Keep a log of your BP readings . For a visual, please reference this diagram: http://ccnc.care/bpdiagram  Provider Name: [facility]     Phone: [phone]  Zone 1: ALL CLEAR  Continue to monitor your symptoms:  . BP reading is less than 140 (top number) or less than 90 (bottom number)  . No right upper stomach pain . No headaches or seeing spots . No feeling nauseated or throwing up . No swelling in face and hands  Zone 2: CAUTION Call your doctor's office for any of the following:  . BP reading is greater than 140 (top number) or greater than 90 (bottom number)  . Stomach pain under your ribs in the middle or right side . Headaches or seeing spots . Feeling nauseated or throwing up . Swelling in face and hands  Zone 3: EMERGENCY  Seek immediate medical care if you have any of  the following:  . BP reading is greater than160 (top number) or greater than 110 (bottom number) . Severe headaches not improving with Tylenol . Serious difficulty catching your breath . Any worsening symptoms from Zone 2   Braxton Hicks Contractions Contractions of the uterus can occur throughout pregnancy, but they are not always a sign that you are in labor. You may have practice contractions called Braxton Hicks contractions. These false labor contractions are sometimes confused with true labor. What are [place] contractions? Braxton Hicks contractions are tightening movements that occur in the muscles of the uterus before labor. Unlike true labor contractions, these contractions do  not result in opening (dilation) and thinning of the cervix. Toward the end of pregnancy (32-34 weeks), Braxton Hicks contractions can happen more often and may become stronger. These contractions are sometimes difficult to tell apart from true labor because they can be very uncomfortable. You should not feel embarrassed if you go to the hospital with false labor. Sometimes, the only way to tell if you are in true labor is for your health care provider to look for changes in the cervix. The health care provider will do a physical exam and may monitor your contractions. If you are not in true labor, the exam should show that your cervix is not dilating and your water has not broken. If there are no other health problems associated with your pregnancy, it is completely safe for you to be sent home with false labor. You may continue to have Braxton Hicks contractions until you go into true labor. How to tell the difference between true labor and false labor True labor  Contractions last 30-70 seconds.  Contractions become very regular.  Discomfort is usually felt in the top of the uterus, and it spreads to the lower abdomen and low back.  Contractions do not go away with walking.  Contractions usually become more intense and increase in frequency.  The cervix dilates and gets thinner. False labor  Contractions are usually shorter and not as strong as true labor contractions.  Contractions are usually irregular.  Contractions are often felt in the front of the lower abdomen and in the groin.  Contractions may go away when you walk around or change positions while lying down.  Contractions get weaker and are shorter-lasting as time goes on.  The cervix usually does not dilate or become thin. Follow these instructions at home:  1. Take over-the-counter and prescription medicines only as told by your health care provider. 2. Keep up with your usual exercises and follow other instructions  from your health care provider. 3. Eat and drink lightly if you think you are going into labor. 4. If Braxton Hicks contractions are making you uncomfortable: ? Change your position from lying down or resting to walking, or change from walking to resting. ? Sit and rest in a tub of warm water. ? Drink enough fluid to keep your urine pale yellow. Dehydration may cause these contractions. ? Do slow and deep breathing several times an hour. 5. Keep all follow-up prenatal visits as told by your health care provider. This is important. Contact a health care provider if:  You have a fever.  You have continuous pain in your abdomen. Get help right away if:  Your contractions become stronger, more regular, and closer together.  You have fluid leaking or gushing from your vagina.  You pass blood-tinged mucus (bloody show).  You have bleeding from your vagina.  You have low back  pain that you never had before.  You feel your baby's head pushing down and causing pelvic pressure.  Your baby is not moving inside you as much as it used to. Summary  Contractions that occur before labor are called Braxton Hicks contractions, false labor, or practice contractions.  Braxton Hicks contractions are usually shorter, weaker, farther apart, and less regular than true labor contractions. True labor contractions usually become progressively stronger and regular, and they become more frequent.  Manage discomfort from [facility] contractions by changing position, resting in a warm bath, drinking plenty of water, or practicing deep breathing. This information is not intended to replace advice given to you by your health care provider. Make sure you discuss any questions you have with your health care provider. Document Revised: 02/16/2017 Document Reviewed: 07/20/2016 Elsevier Patient Education  [place].

## 2020-03-09 NOTE — Progress Notes (Addendum)
[place] PREGNANCY VISIT Patient name: Sherry M Hunnell MRN [id number]  Date of birth: 1981/01/21 Chief Complaint:   High Risk Gestation (NST; GBS, GC/CHL; + pressure; heartburn is getting worse)  History of Present Illness:   Sherry [name] is a 39 y.o. [id number] female at [redacted]w[redacted]d with an Estimated Date of Delivery: 04/03/20 being seen today for ongoing management of a high-risk pregnancy complicated by preeclampsia dx @ 34wks.  Today she reports no worsening in her headaches, takes fioricet and goes away. Denies visual changes, ruq/epigastric pain, n/v. Has irregular contractions and pressure.  Depression screen [facility] 2/9 09/17/2019 03/04/2019 02/18/2019  Decreased Interest 2 0 1  Down, Depressed, Hopeless 1 0 1  PHQ - 2 Score 3 0 2  Altered sleeping 0 - 3  Tired, decreased energy 3 - 3  Change in appetite 0 - 0  Feeling bad or failure about yourself  1 - 3  Trouble concentrating 0 - 2  Moving slowly or fidgety/restless 0 - 0  Suicidal thoughts 0 - 0  PHQ-9 Score 7 - 13    Contractions: Irregular. Vag. Bleeding: None.  Movement: Present. denies leaking of fluid.  Review of Systems:   Pertinent items are noted in HPI Denies abnormal vaginal discharge w/ itching/odor/irritation, headaches, visual changes, shortness of breath, chest pain, abdominal pain, severe nausea/vomiting, or problems with urination or bowel movements unless otherwise stated above. Pertinent History Reviewed:  Reviewed past medical,surgical, social, obstetrical and family history.  Reviewed problem list, medications and allergies. Physical Assessment:   Vitals:   03/09/20 1613 03/09/20 1617  BP: (!) 143/92 (!) 143/94  Pulse: (!) 110 (!) 109  Weight: 194 lb (88 kg)   Body mass index is 34.37 kg/m.           Physical Examination:   General appearance: alert, well appearing, and in no distress  Mental status: alert, oriented to person, place, and time  Skin: warm & dry   Extremities: Edema: Trace    Cardiovascular:  normal heart rate noted  Respiratory: normal respiratory effort, no distress  Abdomen: gravid, soft, non-tender  Pelvic: Cervical exam performed  Dilation: 4.5 Effacement (%): 50 Station: -2  Fetal Status: Fetal Heart Rate (bpm): 135   Movement: Present Presentation: Vertex  Fetal Surveillance Testing today: NST: FHR baseline 135 bpm, Variability: moderate, Accelerations:present, Decelerations:  Absent= Cat 1/Reactive Toco: irregular     Chaperone: Levy Pupa    Results for orders placed or performed in visit on 03/09/20 (from the past 24 hour(s))  POC Urinalysis Dipstick OB   Collection Time: 03/09/20  4:18 PM  Result Value Ref Range   Color, UA     Clarity, UA     Glucose, UA Negative Negative   Bilirubin, UA     Ketones, UA neg    Spec Grav, UA     Blood, UA 3+    pH, UA     POC,PROTEIN,UA Large (3+) Negative, Trace, Small (1+), Moderate (2+), Large (3+), 4+   Urobilinogen, UA     Nitrite, UA neg    Leukocytes, UA Negative Negative   Appearance     Odor      Assessment & Plan:  High-risk pregnancy: [id number] at [redacted]w[redacted]d with an Estimated Date of Delivery: 04/03/20   1) Pre-e w/o severe features, stable, get labs today. Had 24hr urine on 11/29 that was elevated, but had UTI at same time. Will get P:C today. Reviewed severe pre-e s/s, reasons to seek care. IOL scheduled for  12/26 AM (37.1wks, L&D not scheduling IOLs on 12/25).  IOL form faxed via Epic and orders placed   2) FGR 6.8% @ 35.5wks>discussed w/ [facility], continue plan of IOL @ 37wks  3) AMA  Meds: No orders of the defined types were placed in this encounter.   Labs/procedures today: nst, gbs, gc/ct, sve, pre-e labs  Treatment Plan:  IOL 12/26 AM  Reviewed: Preterm labor symptoms and general obstetric precautions including but not limited to vaginal bleeding, contractions, leaking of fluid and fetal movement were reviewed in detail with the patient.  All questions were answered.   Follow-up: Return for will schedule  f/u pp, cancel remaining appts.   Future Appointments  Date Time Provider [place]  03/14/2020  9:10 AM [facility] [place] [facility] None  03/16/2020  3:30 PM Florian Buff, MD [facility]  03/23/2020  3:30 PM Florian Buff, MD [facility]  03/26/2020 10:50 AM [facility] NURSE [facility]  03/30/2020  3:50 PM Roma Schanz, CNM [facility]  04/02/2020 10:10 AM [facility] NURSE [facility]    Orders Placed This Encounter  Procedures  . Strep Gp B NAA  . CBC  . Comprehensive metabolic panel  . Protein / creatinine ratio, urine  . POC Urinalysis Dipstick OB   Roma Schanz CNM, [facility] 03/09/2020 5:13 PM

## 2020-03-10 ENCOUNTER — Encounter (HOSPITAL_COMMUNITY): Payer: Self-pay | Admitting: *Deleted

## 2020-03-10 ENCOUNTER — Telehealth (HOSPITAL_COMMUNITY): Payer: Self-pay | Admitting: *Deleted

## 2020-03-10 LAB — COMPREHENSIVE METABOLIC PANEL
ALT: 20 IU/L (ref 0–32)
AST: 25 IU/L (ref 0–40)
Albumin/Globulin Ratio: 1 — ABNORMAL LOW (ref 1.2–2.2)
Albumin: 3.6 g/dL — ABNORMAL LOW (ref 3.8–4.8)
Alkaline Phosphatase: 151 IU/L — ABNORMAL HIGH (ref 44–121)
BUN/Creatinine Ratio: 10 (ref 9–23)
BUN: 6 mg/dL (ref 6–20)
Bilirubin Total: 0.3 mg/dL (ref 0.0–1.2)
CO2: 20 mmol/L (ref 20–29)
Calcium: 10 mg/dL (ref 8.7–10.2)
Chloride: 100 mmol/L (ref 96–106)
Creatinine, Ser: 0.61 mg/dL (ref 0.57–1.00)
GFR calc Af Amer: 133 mL/min/{1.73_m2} (ref >59)
GFR calc non Af Amer: 115 mL/min/{1.73_m2} (ref >59)
Globulin, Total: 3.6 g/dL (ref 1.5–4.5)
Glucose: 68 mg/dL (ref 65–99)
Potassium: 4.3 mmol/L (ref 3.5–5.2)
Sodium: 135 mmol/L (ref 134–144)
Total Protein: 7.2 g/dL (ref 6.0–8.5)

## 2020-03-10 LAB — CBC
Hematocrit: 31.8 % — ABNORMAL LOW (ref 34.0–46.6)
Hemoglobin: 11.1 g/dL (ref 11.1–15.9)
MCH: 32 pg (ref 26.6–33.0)
MCHC: 34.9 g/dL (ref 31.5–35.7)
MCV: 92 fL (ref 79–97)
Platelets: 236 10*3/uL (ref 150–450)
RBC: 3.47 x10E6/uL — ABNORMAL LOW (ref 3.77–5.28)
RDW: 12.6 % (ref 11.7–15.4)
WBC: 11.6 10*3/uL — ABNORMAL HIGH (ref 3.4–10.8)

## 2020-03-10 LAB — PROTEIN / CREATININE RATIO, URINE
Creatinine, Urine: 45.3 mg/dL
Protein, Ur: 123.7 mg/dL
Protein/Creat Ratio: 2731 mg/g creat — ABNORMAL HIGH (ref 0–200)

## 2020-03-10 NOTE — Telephone Encounter (Signed)
Preadmission screen  

## 2020-03-11 LAB — CERVICOVAGINAL ANCILLARY ONLY
Chlamydia: NEGATIVE
Comment: NEGATIVE
Comment: NORMAL
Neisseria Gonorrhea: NEGATIVE

## 2020-03-11 LAB — STREP GP B NAA: Strep Gp B NAA: NEGATIVE

## 2020-03-14 ENCOUNTER — Inpatient Hospital Stay (HOSPITAL_COMMUNITY): Payer: BC Managed Care – PPO | Admitting: Anesthesiology

## 2020-03-14 ENCOUNTER — Inpatient Hospital Stay (HOSPITAL_COMMUNITY): Payer: BC Managed Care – PPO

## 2020-03-14 ENCOUNTER — Inpatient Hospital Stay (HOSPITAL_COMMUNITY)
Admission: AD | Admit: 2020-03-14 | Discharge: 2020-03-16 | DRG: 807 | Disposition: A | Discharge status: Home / Self Care | Payer: BC Managed Care – PPO | Attending: Obstetrics & Gynecology | Admitting: Obstetrics & Gynecology

## 2020-03-14 ENCOUNTER — Other Ambulatory Visit: Payer: Self-pay

## 2020-03-14 ENCOUNTER — Encounter (HOSPITAL_COMMUNITY): Payer: Self-pay | Admitting: Obstetrics & Gynecology

## 2020-03-14 DIAGNOSIS — O3413 Maternal care for benign tumor of corpus uteri, third trimester: Secondary | ICD-10-CM | POA: Diagnosis present

## 2020-03-14 DIAGNOSIS — D259 Leiomyoma of uterus, unspecified: Secondary | ICD-10-CM | POA: Diagnosis present

## 2020-03-14 DIAGNOSIS — O1490 Unspecified pre-eclampsia, unspecified trimester: Secondary | ICD-10-CM | POA: Diagnosis present

## 2020-03-14 DIAGNOSIS — O1404 Mild to moderate pre-eclampsia, complicating childbirth: Principal | ICD-10-CM | POA: Diagnosis present

## 2020-03-14 DIAGNOSIS — Z30017 Encounter for initial prescription of implantable subdermal contraceptive: Secondary | ICD-10-CM

## 2020-03-14 DIAGNOSIS — O09529 Supervision of elderly multigravida, unspecified trimester: Secondary | ICD-10-CM

## 2020-03-14 DIAGNOSIS — F1290 Cannabis use, unspecified, uncomplicated: Secondary | ICD-10-CM | POA: Diagnosis present

## 2020-03-14 DIAGNOSIS — F17210 Nicotine dependence, cigarettes, uncomplicated: Secondary | ICD-10-CM | POA: Diagnosis present

## 2020-03-14 DIAGNOSIS — O0990 Supervision of high risk pregnancy, unspecified, unspecified trimester: Secondary | ICD-10-CM

## 2020-03-14 DIAGNOSIS — Z3A37 37 weeks gestation of pregnancy: Secondary | ICD-10-CM

## 2020-03-14 DIAGNOSIS — O99334 Smoking (tobacco) complicating childbirth: Secondary | ICD-10-CM | POA: Diagnosis present

## 2020-03-14 DIAGNOSIS — R87619 Unspecified abnormal cytological findings in specimens from cervix uteri: Secondary | ICD-10-CM | POA: Diagnosis present

## 2020-03-14 DIAGNOSIS — O365930 Maternal care for other known or suspected poor fetal growth, third trimester, not applicable or unspecified: Secondary | ICD-10-CM | POA: Diagnosis present

## 2020-03-14 DIAGNOSIS — O3410 Maternal care for benign tumor of corpus uteri, unspecified trimester: Secondary | ICD-10-CM | POA: Diagnosis present

## 2020-03-14 DIAGNOSIS — Z20822 Contact with and (suspected) exposure to covid-19: Secondary | ICD-10-CM | POA: Diagnosis present

## 2020-03-14 DIAGNOSIS — O99324 Drug use complicating childbirth: Secondary | ICD-10-CM | POA: Diagnosis present

## 2020-03-14 DIAGNOSIS — Z975 Presence of (intrauterine) contraceptive device: Secondary | ICD-10-CM

## 2020-03-14 DIAGNOSIS — F17200 Nicotine dependence, unspecified, uncomplicated: Secondary | ICD-10-CM | POA: Diagnosis present

## 2020-03-14 DIAGNOSIS — F329 Major depressive disorder, single episode, unspecified: Secondary | ICD-10-CM | POA: Diagnosis present

## 2020-03-14 DIAGNOSIS — R8271 Bacteriuria: Secondary | ICD-10-CM | POA: Diagnosis present

## 2020-03-14 LAB — RESP PANEL BY RT-PCR (FLU A&B, COVID) ARPGX2
Influenza A by PCR: NEGATIVE
Influenza B by PCR: NEGATIVE
SARS Coronavirus 2 by RT PCR: NEGATIVE

## 2020-03-14 LAB — TYPE AND SCREEN
ABO/RH(D): O POS
Antibody Screen: NEGATIVE

## 2020-03-14 LAB — PROTEIN / CREATININE RATIO, URINE
Creatinine, Urine: 61.52 mg/dL
Protein Creatinine Ratio: 1.61 mg/mg{Cre} — ABNORMAL HIGH (ref 0.00–0.15)
Total Protein, Urine: 99 mg/dL

## 2020-03-14 LAB — CBC
HCT: 29.7 % — ABNORMAL LOW (ref 36.0–46.0)
HCT: 32.3 % — ABNORMAL LOW (ref 36.0–46.0)
Hemoglobin: 9.8 g/dL — ABNORMAL LOW (ref 12.0–15.0)
Hemoglobin: 11 g/dL — ABNORMAL LOW (ref 12.0–15.0)
MCH: 31.2 pg (ref 26.0–34.0)
MCH: 31.9 pg (ref 26.0–34.0)
MCHC: 33 g/dL (ref 30.0–36.0)
MCHC: 34.1 g/dL (ref 30.0–36.0)
MCV: 94.6 fL (ref 80.0–100.0)
MCV: 93.6 fL (ref 80.0–100.0)
Platelets: 219 10*3/uL (ref 150–400)
Platelets: 187 10*3/uL (ref 150–400)
RBC: 3.14 MIL/uL — ABNORMAL LOW (ref 3.87–5.11)
RBC: 3.45 MIL/uL — ABNORMAL LOW (ref 3.87–5.11)
RDW: 13.1 % (ref 11.5–15.5)
RDW: 13.2 % (ref 11.5–15.5)
WBC: 11.2 10*3/uL — ABNORMAL HIGH (ref 4.0–10.5)
WBC: 13.3 10*3/uL — ABNORMAL HIGH (ref 4.0–10.5)
nRBC: 0 % (ref 0.0–0.2)
nRBC: 0 % (ref 0.0–0.2)

## 2020-03-14 LAB — COMPREHENSIVE METABOLIC PANEL
ALT: 20 U/L (ref 0–44)
AST: 21 U/L (ref 15–41)
Albumin: 2.6 g/dL — ABNORMAL LOW (ref 3.5–5.0)
Alkaline Phosphatase: 124 U/L (ref 38–126)
Anion gap: 12 (ref 5–15)
BUN: 9 mg/dL (ref 6–20)
CO2: 20 mmol/L — ABNORMAL LOW (ref 22–32)
Calcium: 10.1 mg/dL (ref 8.9–10.3)
Chloride: 103 mmol/L (ref 98–111)
Creatinine, Ser: 0.6 mg/dL (ref 0.44–1.00)
GFR, Estimated: >60 mL/min (ref >60)
Glucose, Bld: 82 mg/dL (ref 70–99)
Potassium: 4.2 mmol/L (ref 3.5–5.1)
Sodium: 135 mmol/L (ref 135–145)
Total Bilirubin: 0.2 mg/dL — ABNORMAL LOW (ref 0.3–1.2)
Total Protein: 6.2 g/dL — ABNORMAL LOW (ref 6.5–8.1)

## 2020-03-14 MED ORDER — BENZOCAINE-MENTHOL 20-0.5 % EX AERO: 1.0000 "application " | INHALATION_SPRAY | CUTANEOUS | Status: DC | PRN

## 2020-03-14 MED ORDER — SIMETHICONE 80 MG PO CHEW: 80.0000 mg | CHEWABLE_TABLET | ORAL | Status: DC | PRN

## 2020-03-14 MED ORDER — LACTATED RINGERS IV SOLN
500.0000 mL | INTRAVENOUS | Status: DC | PRN
  Administered 2020-03-14: 17:00:00 300 mL via INTRAVENOUS

## 2020-03-14 MED ORDER — DIPHENHYDRAMINE HCL 25 MG PO CAPS: 25.0000 mg | ORAL_CAPSULE | Freq: Four times a day (QID) | ORAL | Status: DC | PRN

## 2020-03-14 MED ORDER — ACETAMINOPHEN 325 MG PO TABS
650.0000 mg | ORAL_TABLET | Freq: Four times a day (QID) | ORAL | Status: DC
  Administered 2020-03-14 – 2020-03-16 (×7): 650 mg via ORAL
  Filled 2020-03-14 (×7): qty 2

## 2020-03-14 MED ORDER — EPHEDRINE 5 MG/ML INJ: 10.0000 mg | INTRAVENOUS | Status: DC | PRN

## 2020-03-14 MED ORDER — WITCH HAZEL-GLYCERIN EX PADS
1.0000 | MEDICATED_PAD | CUTANEOUS | Status: DC | PRN
Start: 2020-03-14 — End: 2020-03-16

## 2020-03-14 MED ORDER — DIPHENHYDRAMINE HCL 50 MG/ML IJ SOLN
12.5000 mg | INTRAMUSCULAR | Status: DC | PRN
  Administered 2020-03-14: 16:00:00 12.5 mg via INTRAVENOUS
  Filled 2020-03-14: qty 1

## 2020-03-14 MED ORDER — HYDRALAZINE HCL 20 MG/ML IJ SOLN: 10.0000 mg | INTRAMUSCULAR | Status: DC | PRN

## 2020-03-14 MED ORDER — ONDANSETRON HCL 4 MG/2ML IJ SOLN: 4.0000 mg | Freq: Four times a day (QID) | INTRAMUSCULAR | Status: DC | PRN

## 2020-03-14 MED ORDER — SENNOSIDES-DOCUSATE SODIUM 8.6-50 MG PO TABS
2.0000 | ORAL_TABLET | Freq: Every day | ORAL | Status: DC
  Administered 2020-03-15 – 2020-03-16 (×2): 2 via ORAL
  Filled 2020-03-14 (×2): qty 2

## 2020-03-14 MED ORDER — FENTANYL-BUPIVACAINE-NACL 0.5-0.125-0.9 MG/250ML-% EP SOLN
12.0000 mL/h | EPIDURAL | Status: DC | PRN
  Filled 2020-03-14: qty 250

## 2020-03-14 MED ORDER — HYDROXYZINE HCL 50 MG PO TABS
50.0000 mg | ORAL_TABLET | Freq: Four times a day (QID) | ORAL | Status: DC | PRN
Start: 2020-03-14 — End: 2020-03-14

## 2020-03-14 MED ORDER — DIBUCAINE (PERIANAL) 1 % EX OINT: 1.0000 "application " | TOPICAL_OINTMENT | CUTANEOUS | Status: DC | PRN

## 2020-03-14 MED ORDER — TERBUTALINE SULFATE 1 MG/ML IJ SOLN: 0.2500 mg | Freq: Once | INTRAMUSCULAR | Status: DC | PRN

## 2020-03-14 MED ORDER — LABETALOL HCL 5 MG/ML IV SOLN: 40.0000 mg | INTRAVENOUS | Status: DC | PRN

## 2020-03-14 MED ORDER — OXYCODONE-ACETAMINOPHEN 5-325 MG PO TABS: 1.0000 | ORAL_TABLET | ORAL | Status: DC | PRN

## 2020-03-14 MED ORDER — FLEET ENEMA 7-19 GM/118ML RE ENEM: 1.0000 | ENEMA | RECTAL | Status: DC | PRN

## 2020-03-14 MED ORDER — PHENYLEPHRINE 40 MCG/ML (10ML) SYRINGE FOR IV PUSH (FOR BLOOD PRESSURE SUPPORT): 80.0000 ug | PREFILLED_SYRINGE | INTRAVENOUS | Status: DC | PRN

## 2020-03-14 MED ORDER — OXYTOCIN-SODIUM CHLORIDE 30-0.9 UT/500ML-% IV SOLN
1.0000 m[IU]/min | INTRAVENOUS | Status: DC
  Administered 2020-03-14: 09:00:00 2 m[IU]/min via INTRAVENOUS
  Filled 2020-03-14: qty 500

## 2020-03-14 MED ORDER — ONDANSETRON HCL 4 MG PO TABS: 4.0000 mg | ORAL_TABLET | ORAL | Status: DC | PRN

## 2020-03-14 MED ORDER — COCONUT OIL OIL
1.0000 | TOPICAL_OIL | Status: DC | PRN
Start: 2020-03-14 — End: 2020-03-16

## 2020-03-14 MED ORDER — ONDANSETRON HCL 4 MG/2ML IJ SOLN: 4.0000 mg | INTRAMUSCULAR | Status: DC | PRN

## 2020-03-14 MED ORDER — LACTATED RINGERS IV SOLN: INTRAVENOUS | Status: DC

## 2020-03-14 MED ORDER — FENTANYL CITRATE (PF) 100 MCG/2ML IJ SOLN: 100.0000 ug | INTRAMUSCULAR | Status: DC | PRN

## 2020-03-14 MED ORDER — IBUPROFEN 600 MG PO TABS
600.0000 mg | ORAL_TABLET | Freq: Four times a day (QID) | ORAL | Status: DC
  Administered 2020-03-14 – 2020-03-16 (×7): 600 mg via ORAL
  Filled 2020-03-14 (×7): qty 1

## 2020-03-14 MED ORDER — LACTATED RINGERS IV SOLN
500.0000 mL | Freq: Once | INTRAVENOUS | Status: AC
  Administered 2020-03-14: 14:00:00 500 mL via INTRAVENOUS

## 2020-03-14 MED ORDER — OXYCODONE-ACETAMINOPHEN 5-325 MG PO TABS: 2.0000 | ORAL_TABLET | ORAL | Status: DC | PRN

## 2020-03-14 MED ORDER — OXYTOCIN BOLUS FROM INFUSION
333.0000 mL | Freq: Once | INTRAVENOUS | Status: AC
  Administered 2020-03-14: 19:00:00 333 mL via INTRAVENOUS

## 2020-03-14 MED ORDER — LIDOCAINE HCL (PF) 1 % IJ SOLN
INTRAMUSCULAR | Status: DC | PRN
  Administered 2020-03-14 (×2): 4 mL via EPIDURAL

## 2020-03-14 MED ORDER — OXYTOCIN-SODIUM CHLORIDE 30-0.9 UT/500ML-% IV SOLN: 2.5000 [IU]/h | INTRAVENOUS | Status: DC

## 2020-03-14 MED ORDER — PRENATAL MULTIVITAMIN CH
1.0000 | ORAL_TABLET | Freq: Every day | ORAL | Status: DC
  Administered 2020-03-15 – 2020-03-16 (×2): 1 via ORAL
  Filled 2020-03-14 (×2): qty 1

## 2020-03-14 MED ORDER — SODIUM CHLORIDE (PF) 0.9 % IJ SOLN
INTRAMUSCULAR | Status: DC | PRN
  Administered 2020-03-14: 12 mL/h via EPIDURAL

## 2020-03-14 MED ORDER — TETANUS-DIPHTH-ACELL PERTUSSIS 5-2.5-18.5 LF-MCG/0.5 IM SUSY: 0.5000 mL | PREFILLED_SYRINGE | Freq: Once | INTRAMUSCULAR | Status: DC

## 2020-03-14 MED ORDER — LABETALOL HCL 5 MG/ML IV SOLN: 20.0000 mg | INTRAVENOUS | Status: DC | PRN

## 2020-03-14 MED ORDER — ACETAMINOPHEN 325 MG PO TABS
650.0000 mg | ORAL_TABLET | ORAL | Status: DC | PRN
  Administered 2020-03-14: 650 mg via ORAL
  Filled 2020-03-14: qty 2

## 2020-03-14 MED ORDER — LABETALOL HCL 5 MG/ML IV SOLN: 80.0000 mg | INTRAVENOUS | Status: DC | PRN

## 2020-03-14 MED ORDER — FERROUS SULFATE 325 (65 FE) MG PO TABS
325.0000 mg | ORAL_TABLET | ORAL | Status: DC
  Administered 2020-03-15: 10:00:00 325 mg via ORAL
  Filled 2020-03-14: qty 1

## 2020-03-14 MED ORDER — LIDOCAINE HCL (PF) 1 % IJ SOLN: 30.0000 mL | INTRAMUSCULAR | Status: DC | PRN

## 2020-03-14 MED ORDER — SOD CITRATE-CITRIC ACID 500-334 MG/5ML PO SOLN
30.0000 mL | ORAL | Status: DC | PRN
Start: 2020-03-14 — End: 2020-03-14

## 2020-03-14 NOTE — Anesthesia Procedure Notes (Signed)
Epidural Patient location during procedure: OB Start time: 03/14/2020 1:41 PM End time: 03/14/2020 1:45 PM  Staffing Anesthesiologist: Brennan Bailey, MD Performed: anesthesiologist   Preanesthetic Checklist Completed: patient identified, IV checked, risks and benefits discussed, monitors and equipment checked, pre-op evaluation and timeout performed  Epidural Patient position: sitting Prep: DuraPrep and site prepped and draped Patient monitoring: continuous pulse ox, blood pressure and heart rate Approach: midline Location: L3-L4 Injection technique: LOR air  Needle:  Needle type: Tuohy  Needle gauge: 17 G Needle length: 9 cm Needle insertion depth: 9 cm Catheter type: closed end flexible Catheter size: 19 Gauge Catheter at skin depth: 14 cm Test dose: negative and Other (1% lidocaine)  Assessment Events: blood not aspirated, injection not painful, no injection resistance, no paresthesia and negative IV test  Additional Notes Patient identified. Risks, benefits, and alternatives discussed with patient including but not limited to bleeding, infection, nerve damage, paralysis, failed block, incomplete pain control, headache, blood pressure changes, nausea, vomiting, reactions to medication, itching, and postpartum back pain. Confirmed with bedside nurse the patient's most recent platelet count. Confirmed with patient that they are not currently taking any anticoagulation, have any bleeding history, or any family history of bleeding disorders. Patient expressed understanding and wished to proceed. All questions were answered. Sterile technique was used throughout the entire procedure. Please see nursing notes for vital signs.   Crisp LOR after two needle redirections, somewhat difficult due to patient movement and inadequate positioning. Test dose was given through epidural catheter and negative prior to continuing to dose epidural or start infusion. Warning signs of high block  given to the patient including shortness of breath, tingling/numbness in hands, complete motor block, or any concerning symptoms with instructions to call for help. Patient was given instructions on fall risk and not to get out of bed. All questions and concerns addressed with instructions to call with any issues or inadequate analgesia.  Reason for block:procedure for pain

## 2020-03-14 NOTE — Anesthesia Preprocedure Evaluation (Signed)
Anesthesia Evaluation  Patient identified by MRN, date of birth, ID band Patient awake    Reviewed: Allergy & Precautions, Patient's Chart, lab work & pertinent test results  History of Anesthesia Complications Negative for: history of anesthetic complications  Airway Mallampati: II  TM Distance: >3 FB Neck ROM: Full    Dental no notable dental hx.    Pulmonary Current Smoker,    Pulmonary exam normal        Cardiovascular negative cardio ROS Normal cardiovascular exam     Neuro/Psych Depression negative neurological ROS     GI/Hepatic negative GI ROS, Neg liver ROS,   Endo/Other  negative endocrine ROS  Renal/GU negative Renal ROS  negative genitourinary   Musculoskeletal negative musculoskeletal ROS (+)   Abdominal   Peds  Hematology negative hematology ROS (+)   Anesthesia Other Findings Day of surgery medications reviewed with patient.  Reproductive/Obstetrics (+) Pregnancy                             Anesthesia Physical Anesthesia Plan  ASA: II  Anesthesia Plan: Epidural   Post-op Pain Management:    Induction:   PONV Risk Score and Plan: Treatment may vary due to age or medical condition  Airway Management Planned: Natural Airway  Additional Equipment:   Intra-op Plan:   Post-operative Plan:   Informed Consent: I have reviewed the patients History and Physical, chart, labs and discussed the procedure including the risks, benefits and alternatives for the proposed anesthesia with the patient or authorized representative who has indicated his/her understanding and acceptance.       Plan Discussed with:   Anesthesia Plan Comments:         Anesthesia Quick Evaluation

## 2020-03-14 NOTE — Discharge Summary (Signed)
Postpartum Discharge Summary  Date of Service updated 03/15/20     Patient Name: Sherry [name] DOB: 06/24/1980 MRN: [id number]  Date of admission: 03/14/2020 Delivery date:03/14/2020  Delivering provider: Randa Ngo  Date of discharge: 03/16/2020  Admitting diagnosis: Preeclampsia [O14.90] Intrauterine pregnancy: [redacted]w[redacted]d     Secondary diagnosis:  Active Problems:   Reactive depression   Supervision of high risk pregnancy, antepartum   Uterine fibroids affecting pregnancy   Advanced maternal age in multigravida   Smoker   Asymptomatic bacteriuria during pregnancy   Marijuana use   Abnormal Pap smear of cervix   Preeclampsia   Vaginal delivery   Nexplanon in place  Additional problems: as noted above  Discharge diagnosis: Vaginal delivery                                           Post partum procedures:none Augmentation: AROM and Pitocin Complications: None  Hospital course: Induction of Labor With Vaginal Delivery   39 y.o. yo [id number] at [redacted]w[redacted]d was admitted to the hospital 03/14/2020 for induction of labor.  Indication for induction: Preeclampsia and FGR.  Patient had an uncomplicated labor course as follows: Membrane Rupture Time/Date: 2:55 PM ,03/14/2020   Delivery Method:Vaginal, Spontaneous  Episiotomy: None  Lacerations:  None  Details of delivery can be found in separate delivery note.  Patient had a routine postpartum course. Given admission Hgb 9.8, pt was started on po iron in postpartum period. Patient is discharged home 03/16/20.   Newborn Data: Birth date:03/14/2020  Birth time:6:44 PM  Gender:Female  Living status:Living  Apgars:9 ,9  Weight:2461 g   Magnesium Sulfate received: No BMZ received: No Rhophylac:N/A MMR:N/A T-DaP:Given prenatally Flu: No Transfusion:No  Physical exam  Vitals:   03/15/20 0912 03/15/20 1507 03/15/20 2300 03/16/20 0545  BP: (!) 136/91 140/87 129/81 114/77  Pulse: 89  (!) 54 94  Resp: $Remo'18 18 18 18  'jVIGG$ Temp: 98.3  F (36.8 C) 98.3 F (36.8 C) 98 F (36.7 C) 98.1 F (36.7 C)  TempSrc: Oral Oral Oral Oral  SpO2: 100%  99% 100%  Weight:      Height:       General: alert, cooperative and no distress Lochia: appropriate Uterine Fundus: firm Incision: N/A DVT Evaluation: No evidence of DVT seen on physical exam. Labs: Lab Results  Component Value Date   WBC 13.3 (H) 03/14/2020   HGB 11.0 (L) 03/14/2020   HCT 32.3 (L) 03/14/2020   MCV 93.6 03/14/2020   PLT 187 03/14/2020   CMP Latest Ref Rng & Units 03/14/2020  Glucose 70 - 99 mg/dL 82  BUN 6 - 20 mg/dL 9  Creatinine 0.44 - 1.00 mg/dL 0.60  Sodium 135 - 145 mmol/L 135  Potassium 3.5 - 5.1 mmol/L 4.2  Chloride 98 - 111 mmol/L 103  CO2 22 - 32 mmol/L 20(L)  Calcium 8.9 - 10.3 mg/dL 10.1  Total Protein 6.5 - 8.1 g/dL 6.2(L)  Total Bilirubin 0.3 - 1.2 mg/dL 0.2(L)  Alkaline Phos 38 - 126 U/L 124  AST 15 - 41 U/L 21  ALT 0 - 44 U/L 20   Edinburgh Score: Edinburgh Postnatal Depression Scale Screening Tool 03/15/2020  I have been able to laugh and see the funny side of things. 0  I have looked forward with enjoyment to things. 1  I have blamed myself unnecessarily when things went wrong.  2  I have been anxious or worried for no good reason. 2  I have felt scared or panicky for no good reason. 1  Things have been getting on top of me. 1  I have been so unhappy that I have had difficulty sleeping. 0  I have felt sad or miserable. 0  I have been so unhappy that I have been crying. 0  The thought of harming myself has occurred to me. 0  Edinburgh Postnatal Depression Scale Total 7     After visit meds:  Allergies as of 03/16/2020      Reactions   Cefprozil Hives   Zithromax [azithromycin] Nausea And Vomiting      Medication List    STOP taking these medications   aspirin 81 MG chewable tablet   butalbital-acetaminophen-caffeine 50-325-40 MG tablet Commonly known as: FIORICET   ferrous sulfate 325 (65 FE) MG tablet    multivitamin-prenatal 27-0.8 MG Tabs tablet   TUMS PO     TAKE these medications   acetaminophen 500 MG tablet Commonly known as: TYLENOL Take 500 mg by mouth every 6 (six) hours as needed.   Blood Pressure Monitor Automat Devi Take BP at home daily.  Alert [place] if >140/90 more than once.   ibuprofen 600 MG tablet Commonly known as: ADVIL Take 1 tablet (600 mg total) by mouth every 6 (six) hours.   NIFEdipine 30 MG 24 hr tablet Commonly known as: PROCARDIA-XL/NIFEDICAL-XL Take 1 tablet (30 mg total) by mouth daily.        Discharge home in stable condition Infant Feeding: Bottle Infant Disposition:home with mother Discharge instruction: per After Visit Summary and Postpartum booklet. Activity: Advance as tolerated. Pelvic rest for 6 weeks.  Diet: routine diet Future Appointments:No future appointments. Follow up Visit: Message sent to [facility] clinic on 03/14/20 to schedule PP appt.  Please schedule this patient for a In person postpartum visit in 6 weeks with the following provider: Any provider. Additional Postpartum F/U:Postpartum Depression checkup, Colpo and BP check 1 week  High risk pregnancy complicated by: Preeclampsia, depression (no meds), abnormal pap (HSIL with +HRHPV), Tobacco & THC use in pregnancy, FGR Delivery mode:  Vaginal, Spontaneous  Anticipated Birth Control:  plan for outpatient Nexplanon   14/97/0263 Arrie Senate, MD

## 2020-03-14 NOTE — Progress Notes (Signed)
Labor Progress Note Sherry [name] is a 39 y.o. [id number] at [redacted]w[redacted]d presented for IOL secondary to preeclampsia w/o severe features and FGR.  S: Pt resting comfortably with epidural in place. No headache, vision changes or other concerns.  O:  BP 119/78   Pulse (!) 104   Temp 98.6 F (37 C) (Oral)   Resp 18   Ht 5\' 3"  (1.6 m)   Wt 88.7 kg   LMP 06/28/2019 (Exact Date)   SpO2 100%   BMI 34.65 kg/m  EFM: baseline 130/moderate variability/+accels/intermittent variable decels Toco: ctx q2-3 min  CVE: Dilation: 6 Effacement (%): 70 Station: -1 Presentation: Vertex Exam by:: Dr. Astrid Drafts   A&P: 39 y.o. [id number] [redacted]w[redacted]d presented for IOL secondary to preeclampsia w/o severe features and FGR. #Labor: Progressing well with up-titration of pitocin. S/p AROM for clear fluid at 1500. Will continue to up-titrate pitocin as clinically indicated. #Pain: epidural in place #FWB: Category 2 strip secondary to intermittent variable decels; reassuringly, moderate variability, multiple accels and good recovery s/p contractions. If variable decels become more pronounced or recurrent may consider amnioinfusion. #GBS negative  #Preeclampsia without severe features: diagnosed at 34 weeks by mild range blood pressures and proteinuria. BP mild range on admit, now normal range s/p epidural. UP:C 1.61 on admission. Preeclampsia labs notable for Hgb 9.8. Mild headache this AM; now improved s/p tylenol.  #THC use: SW postpartum.  #Tobacco Use in Pregnancy: pt reports 1/4 ppd. Encouraged complete cessation on admission.  #Depression: no safety concerns. No current meds. SW postpartum.  #Abnormal pap: HSIL with +HRHPV in 09/2019. Colpo in pregnancy on 11/12/19 without visible lesions (no ECC or biopsies obtained). Needs repeat colpo postpartum.  #Anemia: Hgb 9.8 on admission. Low threshold to recheck CBC s/p delivery to determine if need for IV vs po iron supplementation.  Randa Ngo, MD 3:17 PM

## 2020-03-14 NOTE — Progress Notes (Signed)
Labor Progress Note Sherry [name] is a 38 y.o. [id number] at [redacted]w[redacted]d presented for IOL secondary to preeclampsia w/o severe features and FGR.  S: Pt doing well. Increasing discomfort with contractions. Pt desires epidural at this time. Headache improved s/p tylenol.   O:  BP (!) 113/58   Pulse (!) 106   Temp 98.6 F (37 C) (Oral)   Resp 18   Ht 5\' 3"  (1.6 m)   Wt 88.7 kg   LMP 06/28/2019 (Exact Date)   BMI 34.65 kg/m  EFM: baseline 130/moderate variability/+accels/no decels Toco: ctx q3-4 min  CVE: Dilation: 4.5 Effacement (%): 50 Station: -2 Presentation: Vertex Exam by:: Aron Baba RN   A&P: 39 y.o. [id number] 110w1d presented for IOL secondary to preeclampsia w/o severe features and FGR. #Labor: Progressing well with up-titration of pitocin. Plan for AROM s/p epidural. #Pain: plan for epidural #FWB: Category 1 strip #GBS negative  #Preeclampsia without severe features: diagnosed at 34 weeks by mild range blood pressures and proteinuria. BP mild range on admit. UP:C 1.61 on admission. Preeclampsia labs notable for Hgb 9.8. Mild headache reported; now improved s/p tylenol.  #THC use: SW postpartum.  #Tobacco Use in Pregnancy: pt reports 1/4 ppd. Encouraged complete cessation on admission.  #Depression: no safety concerns. No current meds. SW postpartum.  #Abnormal pap: HSIL with +HRHPV in 09/2019. Colpo in pregnancy on 11/12/19 without visible lesions (no ECC or biopsies obtained). Needs repeat colpo postpartum.  #Anemia: Hgb 9.8 on admission. Low threshold to recheck CBC s/p delivery to determine if need for IV vs po iron supplementation.  Randa Ngo, MD 1:23 PM

## 2020-03-14 NOTE — H&P (Addendum)
OBSTETRIC ADMISSION HISTORY AND PHYSICAL  Sherry [name] is a 39 y.o. female [phone] with IUP at [redacted]w[redacted]d by LMP presenting for IOL-preE (diagnosed at [place]) and FGR (6%ile). She reports +FMs, No LOF, no VB, no blurry vision, headaches or peripheral edema, and RUQ pain.  She plans on bottle feeding. She requests Nexplanon for birth control.  She [facility]   Dating: By LMP --->  Estimated Date of Delivery: 04/03/20  Sono:  03/04/20@[redacted]w[redacted]d , CWD, normal anatomy, cephalic presentation, [id number], 6.8% EFW  Prenatal History/Complications:  preE (diagnosed @34wks ) FGR (6%ile on 03/04/20) Abnormal pap (HGSIL with +HRHPV) >normal colpo in pregnancy THC use Tobacco use Reactive depression (no meds)  Past Medical History: Past Medical History:  Diagnosis Date  . Hodgkin's lymphoma ([place])   . HSV-2 infection   . Miscarriage   . Non Hodgkin's lymphoma ([place]) 2003  . Vaginal Pap smear, abnormal    HGSIL +HRHPV    Past Surgical History: Past Surgical History:  Procedure Laterality Date  . BIOPSY SALIVARY GLAND    . port acath    . TONSILLECTOMY      Obstetrical History: OB History    Gravida  5   Para  2   Term  2   Preterm      AB  2   Living  2     SAB  2   IAB      Ectopic      Multiple      Live Births  2           Social History Social History   Socioeconomic History  . Marital status: Married    Spouse name: Not on file  . Number of children: Not on file  . Years of education: Not on file  . Highest education level: Not on file  Occupational History  . 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 Smoking status: Current Every Day Smoker    Packs/day: 0.25    Types: Cigarettes  . Smokeless tobacco: Never Used  Vaping Use  . Vaping Use: Never used  Substance and Sexual Activity  . Alcohol use: Not Currently    Comment: occassional  . Drug use: Not Currently    Types: Marijuana  . Sexual activity:  Yes    Birth control/protection: None  Other Topics Concern  . Not on file  Social History Narrative  . Not on file   Social Determinants of Health   Financial Resource Strain: Medium Risk  . Difficulty of Paying Living Expenses: Somewhat hard  Food Insecurity: Food Insecurity Present  . Worried About Charity fundraiser in the Last Year: Sometimes true  . Ran Out of Food in the Last Year: Never true  Transportation Needs: No Transportation Needs  . Lack of Transportation (Medical): No  . Lack of Transportation (Non-Medical): No  Physical Activity: Sufficiently Active  . Days of Exercise per Week: 5 days  . Minutes of Exercise per Session: 70 min  Stress: Stress Concern Present  . Feeling of Stress : To some extent  Social Connections: Moderately Isolated  . Frequency of Communication with Friends and Family: Once a week  . Frequency of Social Gatherings with Friends and Family: Once a week  . Attends Religious Services: 1 to 4 times per year  . Active Member of Clubs or Organizations: No  . Attends Archivist Meetings: Never  . Marital Status: Married  Family History: Family History  Problem Relation Age of Onset  . Diabetes Maternal Aunt   . Hypertension Maternal Aunt   . Diabetes Maternal Uncle   . Arthritis Maternal Grandmother   . Cancer Maternal Grandmother        breast  . Diabetes Maternal Grandmother   . Hypertension Maternal Grandmother   . Alcohol abuse Maternal Grandfather   . Diabetes Paternal Grandmother   . Hypertension Paternal Grandmother   . Miscarriages / Stillbirths Cousin     Allergies: Allergies  Allergen Reactions  . Cefprozil Hives  . Zithromax [Azithromycin] Nausea And Vomiting    Medications Prior to Admission  Medication Sig Dispense Refill Last Dose  . acetaminophen (TYLENOL) 500 MG tablet Take 500 mg by mouth every 6 (six) hours as needed.     Marland Kitchen aspirin 81 MG chewable tablet Chew 2 tablets (162 mg total) by mouth  daily. 60 tablet 8   . Blood Pressure Monitoring (BLOOD PRESSURE MONITOR AUTOMAT) DEVI Take BP at home daily.  Alert [place] if >140/90 more than once. 1 each 0   . butalbital-acetaminophen-caffeine (FIORICET) 50-325-40 MG tablet Take 1 tablet by mouth every 4 (four) hours as needed for headache or migraine. 20 tablet 0   . butalbital-acetaminophen-caffeine (FIORICET) 50-325-40 MG tablet Take 1-2 tablets by mouth every 6 (six) hours as needed for headache. 20 tablet 0   . Calcium Carbonate Antacid (TUMS PO) Take by mouth.     . ferrous sulfate 325 (65 FE) MG tablet Take 1 tablet (325 mg total) by mouth 2 (two) times daily with a meal. (Patient taking differently: Take 325 mg by mouth every other day.) 60 tablet 3   . Prenatal Vit-Fe Fumarate-FA (MULTIVITAMIN-PRENATAL) 27-0.8 MG TABS tablet Take 1 tablet by mouth daily at 12 noon.        Review of Systems   All systems reviewed and negative except as stated in HPI  Blood pressure 118/60, pulse (!) 109, temperature 98.6 F (37 C), temperature source Oral, resp. rate 18, height 5\' 3"  (1.6 m), weight 88.7 kg, last menstrual period 06/28/2019, unknown if currently breastfeeding. General appearance: alert, cooperative and no distress Lungs: normal respiratory effort Heart: regular rate and rhythm Abdomen: soft, non-tender; gravid Pelvic: as noted below Extremities: no sign of DVT Presentation: cephalic Fetal monitoringBaseline: 130 bpm, Variability: Good {> 6 bpm), Accelerations: Reactive and Decelerations: Absent Uterine activityFrequency: Every 3-4 minutes Dilation: 4.5 Effacement (%): 50 Station: -2 Exam by:: Aron Baba RN   Prenatal labs: ABO, Rh: --/--/O POS (12/26 0900) Antibody: NEG (12/26 0900) Rubella: 5.40 (06/30 1559) RPR: Non Reactive (10/21 0905)  HBsAg: Negative (06/30 1559)  HIV: Non Reactive (10/21 0905)  GBS: Negative/-- (12/21 0000)  2 hr Glucola passed Genetic screening normal Anatomy US multiple fibroids noted,  otherwise normal  Prenatal Transfer Tool  Maternal Diabetes: No Genetic Screening: Normal Maternal Ultrasounds/Referrals: IUGR Fetal Ultrasounds or other Referrals:  Referred to Materal Fetal Medicine  Maternal Substance Abuse:  Yes:  Type: Smoker, Marijuana Significant Maternal Medications:  None Significant Maternal Lab Results: Group B Strep negative  Results for orders placed or performed during the hospital encounter of 03/14/20 (from the past 24 hour(s))  Resp Panel by RT-PCR (Flu A&B, Covid) Nasopharyngeal Swab   Collection Time: 03/14/20  8:21 AM   Specimen: Nasopharyngeal Swab; Nasopharyngeal(NP) swabs in vial transport medium  Result Value Ref Range   SARS Coronavirus 2 by RT PCR NEGATIVE NEGATIVE   Influenza A by PCR NEGATIVE NEGATIVE  Influenza B by PCR NEGATIVE NEGATIVE  CBC   Collection Time: 03/14/20  8:21 AM  Result Value Ref Range   WBC 11.2 (H) 4.0 - 10.5 K/uL   RBC 3.14 (L) 3.87 - 5.11 MIL/uL   Hemoglobin 9.8 (L) 12.0 - 15.0 g/dL   HCT 29.7 (L) 36.0 - 46.0 %   MCV 94.6 80.0 - 100.0 fL   MCH 31.2 26.0 - 34.0 pg   MCHC 33.0 30.0 - 36.0 g/dL   RDW 13.1 11.5 - 15.5 %   Platelets 219 150 - 400 K/uL   nRBC 0.0 0.0 - 0.2 %  Comprehensive metabolic panel   Collection Time: 03/14/20  8:21 AM  Result Value Ref Range   Sodium 135 135 - 145 mmol/L   Potassium 4.2 3.5 - 5.1 mmol/L   Chloride 103 98 - 111 mmol/L   CO2 20 (L) 22 - 32 mmol/L   Glucose, Bld 82 70 - 99 mg/dL   BUN 9 6 - 20 mg/dL   Creatinine, Ser 0.60 0.44 - 1.00 mg/dL   Calcium 10.1 8.9 - 10.3 mg/dL   Total Protein 6.2 (L) 6.5 - 8.1 g/dL   Albumin 2.6 (L) 3.5 - 5.0 g/dL   AST 21 15 - 41 U/L   ALT 20 0 - 44 U/L   Alkaline Phosphatase 124 38 - 126 U/L   Total Bilirubin 0.2 (L) 0.3 - 1.2 mg/dL   GFR, Estimated >60 >60 mL/min   Anion gap 12 5 - 15  Type and screen   Collection Time: 03/14/20  9:00 AM  Result Value Ref Range   ABO/RH(D) O POS    Antibody Screen NEG    Sample Expiration       03/17/2020,2359 Performed at [facility], [address], [place], [place] [address]   Protein / creatinine ratio, urine   Collection Time: 03/14/20  9:25 AM  Result Value Ref Range   Creatinine, Urine 61.52 mg/dL   Total Protein, Urine 99 mg/dL   Protein Creatinine Ratio 1.61 (H) 0.00 - 0.15 mg/mg[Cre]    Patient Active Problem List   Diagnosis Date Noted  . Preeclampsia 03/09/2020  . Abnormal Pap smear of cervix 11/18/2019  . Asymptomatic bacteriuria during pregnancy 09/23/2019  . Marijuana use 09/23/2019  . Supervision of high risk pregnancy, antepartum 09/17/2019  . Uterine fibroids affecting pregnancy 09/17/2019  . Advanced maternal age in multigravida 09/17/2019  . Smoker 09/17/2019  . Reactive depression 02/18/2019    Assessment/Plan:  Sherry [name] is a 39 y.o. [id number] at [redacted]w[redacted]d here for IOL secondary to preeclampsia without severe features and FGR (6.8%ile).  #IOL: Discussed induction process with patient. Given cervical exam will initiate pitocin.  #Preeclampsia without severe features: diagnosed at 34 weeks by mild range blood pressures and proteinuria. BP mild range on admit. Patient asymptomatic. Repeat preE labs pending.  #THC use: SW postpartum.  #Tobacco Use in Pregnancy: pt reports 1/4 ppd. Encouraged complete cessation on admission.  #Depression: no safety concerns. No current meds. SW postpartum.  #Abnormal pap: HSIL with +HRHPV in 09/2019. Colpo in pregnancy on 11/12/19 without visible lesions (no ECC or biopsies obtained). Needs repeat colpo postpartum.  #Pain: Plan for epidural per pt request #FWB:  Category 1 strip #ID: GBS neg #MOF: bottle #MOC: plan for Nexplanon #Circ: n/a  Randa Ngo, MD OB Fellow, Faculty Practice 03/14/2020 12:41 PM

## 2020-03-15 DIAGNOSIS — Z30017 Encounter for initial prescription of implantable subdermal contraceptive: Secondary | ICD-10-CM

## 2020-03-15 DIAGNOSIS — Z975 Presence of (intrauterine) contraceptive device: Secondary | ICD-10-CM

## 2020-03-15 DIAGNOSIS — O1404 Mild to moderate pre-eclampsia, complicating childbirth: Secondary | ICD-10-CM | POA: Diagnosis not present

## 2020-03-15 LAB — RPR: RPR Ser Ql: NONREACTIVE

## 2020-03-15 MED ORDER — LIDOCAINE HCL 1 % IJ SOLN
0.0000 mL | Freq: Once | INTRAMUSCULAR | Status: AC | PRN
Start: 2020-03-15 — End: 2020-03-15
  Administered 2020-03-15: 07:00:00 20 mL via INTRADERMAL
  Filled 2020-03-15: qty 20

## 2020-03-15 MED ORDER — CALCIUM CARBONATE ANTACID 500 MG PO CHEW
1.0000 | CHEWABLE_TABLET | Freq: Two times a day (BID) | ORAL | Status: DC | PRN
  Administered 2020-03-15 (×2): 200 mg via ORAL
  Filled 2020-03-15 (×2): qty 1

## 2020-03-15 MED ORDER — ETONOGESTREL 68 MG ~~LOC~~ IMPL
68.0000 mg | DRUG_IMPLANT | Freq: Once | SUBCUTANEOUS | Status: AC
  Administered 2020-03-15: 07:00:00 68 mg via SUBCUTANEOUS
  Filled 2020-03-15: qty 1

## 2020-03-15 MED ORDER — NIFEDIPINE ER OSMOTIC RELEASE 30 MG PO TB24
30.0000 mg | ORAL_TABLET | Freq: Every day | ORAL | Status: DC
  Administered 2020-03-15 – 2020-03-16 (×2): 30 mg via ORAL
  Filled 2020-03-15 (×2): qty 1

## 2020-03-15 NOTE — Procedures (Signed)
Post-Placental Nexplanon Insertion Procedure Note  Patient was identified. Informed consent was signed, signed copy in chart. A time-out was performed.    The insertion site was identified 8-10 cm (3-4 inches) from the medial epicondyle of the humerus and 3-5 cm (1.25-2 inches) posterior to (below) the sulcus (groove) between the biceps and triceps muscles of the patient's left arm and marked. The site was prepped and draped in the usual sterile fashion. Pt was prepped with alcohol swab and then injected with 10 cc of 1% lidocaine. The site was prepped with betadine. Nexplanon removed form packaging,  Device confirmed in needle, then inserted full length of needle and withdrawn per handbook instructions. Provider and patient verified presence of the implant in the woman's arm by palpation. Pt insertion site was covered with steristrips/adhesive bandage and pressure bandage. There was minimal blood loss. Patient tolerated procedure well.  Patient was given post procedure instructions and Nexplanon user card with expiration date. Condoms were recommended for STI prevention. Patient was asked to keep the pressure dressing on for 24 hours to minimize bruising and keep the adhesive bandage on for 3-5 days. The patient verbalized understanding of the plan of care and agrees.   Alric Seton, MD OB Fellow, Faculty [facility] 03/15/2020 7:07 AM

## 2020-03-15 NOTE — Progress Notes (Signed)
Pt c/o heartburn, requesting to take tums as she was doing in pregnancy. Dr. Germaine Pomfret called and order placed.

## 2020-03-15 NOTE — Progress Notes (Signed)
Post Partum Day 1 Subjective:  Patient is doing well without complaints. Ambulating without difficulty. Voiding and passing flatus. Tolerating PO. Abdominal pain improved. Vaginal bleeding decreased.  Objective: Blood pressure 123/74, pulse 90, temperature 98.2 F (36.8 C), temperature source Oral, resp. rate 18, height 5\' 3"  (1.6 m), weight 88.7 kg, last menstrual period 06/28/2019, SpO2 100 %, unknown if currently breastfeeding.  Physical Exam:  General: alert, cooperative and no distress Lochia: appropriate Uterine Fundus: firm Incision: n/a DVT Evaluation: No evidence of DVT seen on physical exam.  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   LOS: 1 day   03/16/20 03/15/2020, 7:08 AM

## 2020-03-15 NOTE — Clinical Social Work Maternal (Addendum)
CLINICAL SOCIAL WORK MATERNAL/CHILD NOTE  Patient Details  Name: Sherry [name] MRN: [id number] Date of Birth: Oct 11, 1980  Date:  03/15/2020  Clinical Social Worker Initiating Note:  Hortencia Pilar, LCSW Date/Time: Initiated:  03/15/20/0945     Child's Name:  Sherry [name]   Biological Parents:  Mother,Father (Sherry adn Sherry [name])   Need for Interpreter:  None   Reason for Referral:  Behavioral Health Concerns,Current Substance Use/Substance Use During Pregnancy    Address:  [address] [place] [address]    Phone number:  [phone] (home)     Additional phone number: none   Household Members/Support Persons (HM/SP):   Household Member/Support Person 2,Household Member/Support Person 1   HM/SP Name Relationship DOB or Age  HM/SP -1  Sherry Crafts MOB 1980/06/10  HM/SP -2 Sherry [name] FOB    HM/SP -3  Cristopher Everardo All son 43 years old  HM/SP -4 Teryl Lucy Daughter  58 years old  HM/SP -5        HM/SP -6        HM/SP -7        HM/SP -8          Natural Supports (not living in the home):  Spouse/significant other   Professional Supports: None   Employment: Full-time   Type of Work: on leave   Education:  [employer] school graduate   Homebound arranged:  n/a  Surveyor, quantity Resources:  Banker ([employer])   Other Resources:  Sales executive ,[employer] [facility]   Cultural/Religious Considerations Which May Impact Care:  none reported.   Strengths:  Ability to meet basic needs ,Compliance with medical plan ,Home prepared for child ,Pediatrician chosen   Psychotropic Medications:       None reported.   Pediatrician:    [facility]  Pediatrician List:   [facility]  [facility]      Pediatrician Fax Number:    Risk Factors/Current Problems:  None   Cognitive State:  Alert ,Able to Concentrate ,Insightful    Mood/Affect:  Happy ,Interested ,Relaxed  ,Comfortable ,Calm    CSW Assessment: CSW consulted due to [facility] use earlier in pregnancy as well as MOB having a hx of depression. CSW went to speak with MOB at bedside to address further needs.   CSW congratulated MOB on the birth of infant. CSW advised MOB of CSW's role and the reason for CSW coming to speak with her. MOB expressed that she has never been clinically diagnosed with depression nor anxiety but does report that she has signs and symptoms sometimes. MOB reported to CSW having a hx of miscarriages which in turn increased anxiety in the past and during this pregnancy. MOB expressed that she spoke with her OB provider about her feelings in the past however MOB expressed not being able to recall what occurred to help her. MOB expressed that she would smoke THC earlier in her pregacny as that was the most difficult time for her regarding this pregnant. MOB expressed feelings of anxiousness at the start of her pregnancy due to hx of losses early on. MOB reported to CSW that she was finally able to get herself together as she thought "this may be a viable pregnancy". MOB expressed at that time she was able to calm herself and has been doing well. MOB expressed that she doesn't recall ever being on medication or being in  therapy for her mental health. MOB declined the need for therapy resources at this time. Mob also denies SI, HI and DV to this CSW when asked.   CSW inquired from [facility] on possible other reasons for using THC while pregnant. MOB expressed that she was just dealing with anxiety earlier on. MOB expressed that she stopped using THC once she found out that she was pregnant however. CSW understanding and advised MOB of the hospital drug screen policy. CSW advised MOB that infants UDS is negative however CSW would need to monitor infants CDS. CSW advised MOB that if infants CDS is positive for any substances that MOB was not given or prescribed by and MD then CSW would need to make a CPS  report. MOB indicated that she understood and expressed no current or previous CPS hx. MOB also declined having any other substance use.   CSW was advised that MOB has support from her spouse during this time MOB expressed that spouse will be involvedwith infant but is currently home caring for the other children at this time. MOB reported that she has all needed items to care for infant with plans for infant to sleep in crib once arrived home. MOB was given PPD and SIDS education. MOB thanked CSW and expressed no other needs.   CSW will continue to monitor infants CDS and make CPS report if warranted. No barrie's to discharge.   CSW Plan/Description:  No Further Intervention Required/No Barriers to Discharge,Sudden Infant Death Syndrome (SIDS) Education,Perinatal Mood and Anxiety Disorder (PMADs) Education,Hospital Drug Screen Policy Information,CSW Will Continue to Monitor Umbilical Cord Tissue Drug Screen Results and Make Report if Celso Sickle, LCSWA 03/15/2020, 10:11 AM

## 2020-03-15 NOTE — Anesthesia Postprocedure Evaluation (Signed)
Anesthesia Post Note  Patient: Sherry [name]  Procedure(s) Performed: AN AD HOC LABOR EPIDURAL     Patient location during evaluation: Mother Baby Anesthesia Type: Epidural Level of consciousness: awake Pain management: satisfactory to patient Vital Signs Assessment: post-procedure vital signs reviewed and stable Respiratory status: spontaneous breathing Cardiovascular status: stable Anesthetic complications: no   No complications documented.  Last Vitals:  Vitals:   03/15/20 0550 03/15/20 0912  BP: 123/74 (!) 136/91  Pulse: 90 89  Resp: 18 18  Temp: 36.8 C 36.8 C  SpO2: 100% 100%    Last Pain:  Vitals:   03/15/20 1211  TempSrc:   PainSc: 8    Pain Goal: Patients Stated Pain Goal: 2 (03/15/20 0550)                 Cephus Shelling

## 2020-03-16 ENCOUNTER — Other Ambulatory Visit: Payer: BC Managed Care – PPO | Admitting: Obstetrics & Gynecology

## 2020-03-16 ENCOUNTER — Other Ambulatory Visit (HOSPITAL_COMMUNITY): Payer: Self-pay | Admitting: Obstetrics & Gynecology

## 2020-03-16 DIAGNOSIS — Z029 Encounter for administrative examinations, unspecified: Secondary | ICD-10-CM

## 2020-03-16 MED ORDER — IBUPROFEN 600 MG PO TABS: 600.0000 mg | ORAL_TABLET | Freq: Four times a day (QID) | ORAL | 0 refills | Status: DC

## 2020-03-16 MED ORDER — NIFEDIPINE ER OSMOTIC RELEASE 30 MG PO TB24: 30.0000 mg | ORAL_TABLET | Freq: Every day | ORAL | 3 refills | Status: DC

## 2020-03-16 MED FILL — NIFEdipine ER 30 MG TB24: 30 | 30 days supply | Qty: 30 | Fill #0

## 2020-03-16 MED FILL — IBUPROFEN 600 MG TABLET: 600 | 8 days supply | Qty: 30 | Fill #0

## 2020-03-16 NOTE — Discharge Instructions (Signed)
-okay to stop iron, levels have been restored -procardia every day for blood pressure  Postpartum Care After Vaginal Delivery This sheet gives you information about how to care for yourself from the time you deliver your baby to up to 6-12 weeks after delivery (postpartum period). Your health care provider may also give you more specific instructions. If you have problems or questions, contact your health care provider. Follow these instructions at home: Vaginal bleeding  It is normal to have vaginal bleeding (lochia) after delivery. Wear a sanitary pad for vaginal bleeding and discharge. ? During the first week after delivery, the amount and appearance of lochia is often similar to a menstrual period. ? Over the next few weeks, it will gradually decrease to a dry, yellow-brown discharge. ? For most women, lochia stops completely by 4-6 weeks after delivery. Vaginal bleeding can vary from woman to woman.  Change your sanitary pads frequently. Watch for any changes in your flow, such as: ? A sudden increase in volume. ? A change in color. ? Large blood clots.  If you pass a blood clot from your vagina, save it and call your health care provider to discuss. Do not flush blood clots down the toilet before talking with your health care provider.  Do not use tampons or douches until your health care provider says this is safe.  If you are not breastfeeding, your period should return 6-8 weeks after delivery. If you are feeding your child breast milk only (exclusive breastfeeding), your period may not return until you stop breastfeeding. Perineal care  Keep the area between the vagina and the anus (perineum) clean and dry as told by your health care provider. Use medicated pads and pain-relieving sprays and creams as directed.  If you had a cut in the perineum (episiotomy) or a tear in the vagina, check the area for signs of infection until you are healed. Check for: ? More redness, swelling,  or pain. ? Fluid or blood coming from the cut or tear. ? Warmth. ? Pus or a bad smell.  You may be given a squirt bottle to use instead of wiping to clean the perineum area after you go to the bathroom. As you start healing, you may use the squirt bottle before wiping yourself. Make sure to wipe gently.  To relieve pain caused by an episiotomy, a tear in the vagina, or swollen veins in the anus (hemorrhoids), try taking a warm sitz bath 2-3 times a day. A sitz bath is a warm water bath that is taken while you are sitting down. The water should only come up to your hips and should cover your buttocks. Breast care  Within the first few days after delivery, your breasts may feel heavy, full, and uncomfortable (breast engorgement). Milk may also leak from your breasts. Your health care provider can suggest ways to help relieve the discomfort. Breast engorgement should go away within a few days.  If you are breastfeeding: ? Wear a bra that supports your breasts and fits you well. ? Keep your nipples clean and dry. Apply creams and ointments as told by your health care provider. ? You may need to use breast pads to absorb milk that leaks from your breasts. ? You may have uterine contractions every time you breastfeed for up to several weeks after delivery. Uterine contractions help your uterus return to its normal size. ? If you have any problems with breastfeeding, work with your health care provider or Advertising copywriter.  If  you are not breastfeeding: ? Avoid touching your breasts a lot. Doing this can make your breasts produce more milk. ? Wear a good-fitting bra and use cold packs to help with swelling. ? Do not squeeze out (express) milk. This causes you to make more milk. Intimacy and sexuality  Ask your health care provider when you can engage in sexual activity. This may depend on: ? Your risk of infection. ? How fast you are healing. ? Your comfort and desire to engage in sexual  activity.  You are able to get pregnant after delivery, even if you have not had your period. If desired, talk with your health care provider about methods of birth control (contraception). Medicines  Take over-the-counter and prescription medicines only as told by your health care provider.  If you were prescribed an antibiotic medicine, take it as told by your health care provider. Do not stop taking the antibiotic even if you start to feel better. Activity  Gradually return to your normal activities as told by your health care provider. Ask your health care provider what activities are safe for you.  Rest as much as possible. Try to rest or take a nap while your baby is sleeping. Eating and drinking   Drink enough fluid to keep your urine pale yellow.  Eat high-fiber foods every day. These may help prevent or relieve constipation. High-fiber foods include: ? Whole grain cereals and breads. ? Brown rice. ? Beans. ? Fresh fruits and vegetables.  Do not try to lose weight quickly by cutting back on calories.  Take your prenatal vitamins until your postpartum checkup or until your health care provider tells you it is okay to stop. Lifestyle  Do not use any products that contain nicotine or tobacco, such as cigarettes and e-cigarettes. If you need help quitting, ask your health care provider.  Do not drink alcohol, especially if you are breastfeeding. General instructions  Keep all follow-up visits for you and your baby as told by your health care provider. Most women visit their health care provider for a postpartum checkup within the first 3-6 weeks after delivery. Contact a health care provider if:  You feel unable to cope with the changes that your child brings to your life, and these feelings do not go away.  You feel unusually sad or worried.  Your breasts become red, painful, or hard.  You have a fever.  You have trouble holding urine or keeping urine from  leaking.  You have little or no interest in activities you used to enjoy.  You have not breastfed at all and you have not had a menstrual period for 12 weeks after delivery.  You have stopped breastfeeding and you have not had a menstrual period for 12 weeks after you stopped breastfeeding.  You have questions about caring for yourself or your baby.  You pass a blood clot from your vagina. Get help right away if:  You have chest pain.  You have difficulty breathing.  You have sudden, severe leg pain.  You have severe pain or cramping in your lower abdomen.  You bleed from your vagina so much that you fill more than one sanitary pad in one hour. Bleeding should not be heavier than your heaviest period.  You develop a severe headache.  You faint.  You have blurred vision or spots in your vision.  You have bad-smelling vaginal discharge.  You have thoughts about hurting yourself or your baby. If you ever feel like you  may hurt yourself or others, or have thoughts about taking your own life, get help right away. You can go to the nearest emergency department or call:  Your local emergency services (911 in the [place]  A suicide crisis helpline, such as the [place] at 217-846-2144. This is open 24 hours a day. Summary  The period of time right after you deliver your newborn up to 6-12 weeks after delivery is called the postpartum period.  Gradually return to your normal activities as told by your health care provider.  Keep all follow-up visits for you and your baby as told by your health care provider. This information is not intended to replace advice given to you by your health care provider. Make sure you discuss any questions you have with your health care provider. Document Revised: 03/09/2017 Document Reviewed: 12/18/2016 Elsevier Patient Education  2020 [employer].

## 2020-03-21 NOTE — Progress Notes (Signed)
This encounter was created in error - please disregard.

## 2020-03-23 ENCOUNTER — Other Ambulatory Visit: Payer: BC Managed Care – PPO | Admitting: Obstetrics & Gynecology

## 2020-03-26 ENCOUNTER — Other Ambulatory Visit: Payer: BC Managed Care – PPO

## 2020-03-29 ENCOUNTER — Telehealth: Payer: Self-pay | Admitting: Women's Health

## 2020-03-29 ENCOUNTER — Encounter: Payer: Self-pay | Admitting: *Deleted

## 2020-03-29 NOTE — Telephone Encounter (Signed)
MyChart message sent to see if pt can send a message with a pic of the bruise. [place]

## 2020-03-29 NOTE — Telephone Encounter (Signed)
Pt had Nexplanon put in at the hospital 2 weeks ago Has a huge painful bruise & would like someone to check it Schedule is full next several days - please advise

## 2020-03-30 ENCOUNTER — Other Ambulatory Visit: Payer: BC Managed Care – PPO | Admitting: Women's Health

## 2020-04-01 ENCOUNTER — Ambulatory Visit: Payer: BC Managed Care – PPO | Admitting: Advanced Practice Midwife

## 2020-04-02 ENCOUNTER — Other Ambulatory Visit: Payer: BC Managed Care – PPO

## 2020-04-20 ENCOUNTER — Other Ambulatory Visit: Payer: Self-pay

## 2020-04-20 ENCOUNTER — Telehealth (INDEPENDENT_AMBULATORY_CARE_PROVIDER_SITE_OTHER): Payer: BC Managed Care – PPO | Admitting: Women's Health

## 2020-04-20 ENCOUNTER — Encounter: Payer: Self-pay | Admitting: Women's Health

## 2020-04-20 DIAGNOSIS — D259 Leiomyoma of uterus, unspecified: Secondary | ICD-10-CM

## 2020-04-20 DIAGNOSIS — O99335 Smoking (tobacco) complicating the puerperium: Secondary | ICD-10-CM

## 2020-04-20 DIAGNOSIS — O99893 Other specified diseases and conditions complicating puerperium: Secondary | ICD-10-CM

## 2020-04-20 DIAGNOSIS — I10 Essential (primary) hypertension: Secondary | ICD-10-CM | POA: Insufficient documentation

## 2020-04-20 DIAGNOSIS — F17210 Nicotine dependence, cigarettes, uncomplicated: Secondary | ICD-10-CM

## 2020-04-20 DIAGNOSIS — Z8759 Personal history of other complications of pregnancy, childbirth and the puerperium: Secondary | ICD-10-CM

## 2020-04-20 DIAGNOSIS — O165 Unspecified maternal hypertension, complicating the puerperium: Secondary | ICD-10-CM

## 2020-04-20 MED ORDER — AMLODIPINE BESYLATE 5 MG PO TABS: 5.0000 mg | ORAL_TABLET | Freq: Every day | ORAL | 0 refills | Status: DC

## 2020-04-20 NOTE — Progress Notes (Signed)
TELEHEALTH VIRTUAL POSTPARTUM VISIT ENCOUNTER NOTE Patient name: Sherry [name] MRN [id number]  Date of birth: 05/14/1980  I connected with patient on 04/20/20 at  2:00 PM EST by MyChart video  and verified that I am speaking with the correct person using two identifiers. Pt is not currently in our office, she is at [employer].  The provider is in the office.    I discussed the limitations, risks, security and privacy concerns of performing an evaluation and management service by telephone and the availability of in person appointments. I also discussed with the patient that there may be a patient responsible charge related to this service. The patient expressed understanding and agreed to proceed.  Chief Complaint:   Postpartum Care  History of Present Illness:   Sherry HERMIE REAGOR is a 40 y.o. [phone] African American female being seen today for a postpartum visit. She is 5 weeks postpartum following a spontaneous vaginal delivery at 37.1 gestational weeks. IOL: Yes, for pre-e w/o severe features, FGR. Anesthesia: epidural.  Laceration: none.  Complications: none. Inpatient contraception: yes nexplanon on 03/15/20.   Pregnancy complicated by pre-e, FGR, fibroids. Tobacco use: yes smokes <1/2ppd. Substance use disorder: no. 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 Normal colpo 11/18/19. Needs repeat colpo 8wks pp   No LMP recorded.  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 Bleeding thin lochia. Bowel function is normal. Bladder function is normal. Urinary incontinence? No, fecal incontinence? No Patient is not sexually active. Last sexual activity: prior to birth of baby.  Desired contraception: PP Nexplanon placed. Patient does not want a pregnancy in the future.  Desired family size is 3 children.   Upstream - 04/20/20 1412      Pregnancy Intention Screening   Does the patient want to become pregnant  in the next year? No    Does the patient's partner want to become pregnant in the next year? No    Would the patient like to discuss contraceptive options today? No      Contraception Wrap Up   Current Method Hormonal Implant    End Method Hormonal Implant    Contraception Counseling Provided No          The pregnancy intention screening data noted above was reviewed. Potential methods of contraception were discussed. The patient elected to proceed with Hormonal Implant.   Edinburgh Postpartum Depression Screening: Negative, has good and bad days. No SI/HI/II. Stuff she normally does, she doesn't feel like she can get to.   Edinburgh Postnatal Depression Scale - 04/20/20 1408      Edinburgh Postnatal Depression Scale:  In the Past 7 Days   I have been able to laugh and see the funny side of things. 0    I have looked forward with enjoyment to things. 0    I have blamed myself unnecessarily when things went wrong. 2    I have been anxious or worried for no good reason. 2    I have felt scared or panicky for no good reason. 0    Things have been getting on top of me. 1    I have been so unhappy that I have had difficulty sleeping. 0    I have felt sad or miserable. 0    I have been so unhappy that I have been crying. 1    The thought of harming myself has occurred to  me. 0    Edinburgh Postnatal Depression Scale Total 6          Baby's course has been uncomplicated. Baby is feeding by bottle. Infant has a pediatrician/family doctor? Yes.  Childcare strategy if returning to work/school: sister.  Pt has material needs met for her and baby: Yes.   Review of Systems:   Pertinent items are noted in HPI Denies Abnormal vaginal discharge w/ itching/odor/irritation, headaches, visual changes, shortness of breath, chest pain, abdominal pain, severe nausea/vomiting, or problems with urination or bowel movements. Pertinent History Reviewed:  Reviewed past medical,surgical, obstetrical and  family history.  Reviewed problem list, medications and allergies. OB History  Gravida Para Term Preterm AB Living  $Remov'5 3 3   2 3  'YhYppE$ SAB IAB Ectopic Multiple Live Births  2     0 3    # Outcome Date GA Lbr Len/2nd Weight Sex Delivery Anes PTL Lv  5 Term 03/14/20 [redacted]w[redacted]d 03:45 / 00:04 5 lb 6.8 oz (2.461 kg) F Vag-Spont EPI  LIV  4 SAB 01/02/19 [redacted]w[redacted]d         3 Term 09/19/11 [redacted]w[redacted]d  7 lb 0.2 oz (3.181 kg) F Vag-Vacuum EPI  LIV  2 Term 08/12/04 [redacted]w[redacted]d  6 lb 12 oz (3.062 kg) M Vag-Spont EPI Y LIV  1 SAB 2004           Physical Assessment:   Vitals:   04/19/20 1405  BP: (!) 136/97  Pulse: 88  There is no height or weight on file to calculate BMI.         Physical Examination:   General:  Alert, oriented and cooperative.   Mental Status: Normal mood and affect perceived.   Rest of physical exam deferred due to type of encounter       No results found for this or any previous visit (from the past 24 hour(s)).  Assessment & Plan:  1) Postpartum exam 2) 5 wks s/p spontaneous vaginal delivery after IOL for pre-e and FGR 3) bottle feeding 4) Depression screening 5) s/p Nexplanon insertion in hospital 6) Abnormal pap> needs repeat colpo 8wks pp- schedule today 7) PPHTN> rx norvasc 5g, stop 2d before colpo visit  Essential components of care per ACOG recommendations:  1.  Mood and well being:  . If positive depression screen, discussed and plan developed.  . If using tobacco we discussed reduction/cessation and risk of relapse . If current substance abuse, we discussed and referral to local resources was offered.   2. Infant care and feeding:  . If breastfeeding, discussed returning to work, pumping, breastfeeding-associated pain, guidance regarding return to fertility while lactating if not using another method. If needed, patient was provided with a letter to be allowed to pump q 2-3hrs to support lactation in a private location with access to a refrigerator to store breastmilk.    . Recommended that all caregivers be immunized for flu, pertussis and other preventable communicable diseases . If pt does not have material needs met for her/baby, referred to local resources for help obtaining these.  3. Sexuality, contraception and birth spacing . Provided guidance regarding sexuality, management of dyspareunia, and resumption of intercourse . Discussed avoiding interpregnancy interval <89mths and recommended birth spacing of 18 months  4. Sleep and fatigue . Discussed coping options for fatigue and sleep disruption . Encouraged family/partner/community support of 4 hrs of uninterrupted sleep to help with mood and fatigue  5. Physical recovery  . If pt had a C/S, assessed  incisional pain and providing guidance on normal vs prolonged recovery . If pt had a laceration, perineal healing and pain reviewed.  . If urinary or fecal incontinence, discussed management and referred to PT or uro/gyn if indicated  . Patient is safe to resume physical activity. Discussed attainment of healthy weight.  6.  Chronic disease management . Discussed pregnancy complications if any, and their implications for future childbearing and long-term maternal health. . Review recommendations for prevention of recurrent pregnancy complications, such as 17 hydroxyprogesterone caproate to reduce risk for recurrent PTB not applicable, or aspirin to reduce risk of preeclampsia yes. . Pt had GDM: No. If yes, 2hr GTT scheduled: not applicable. . Reviewed medications and non-pregnant dosing including consideration of whether pt is breastfeeding using a reliable resource such as LactMed: not applicable . Referred for f/u w/ PCP or subspecialist providers as indicated: not applicable  7. Health maintenance . Mammogram at 39yo or earlier if indicated . Pap smears as indicated  Meds:  Meds ordered this encounter  Medications  . amLODipine (NORVASC) 5 MG tablet    Sig: Take 1 tablet (5 mg total) by mouth  daily.    Dispense:  30 tablet    Refill:  0    Order Specific Question:   Supervising Provider    Answer:   Florian Buff [[id number]]    Follow-up: Return in about 3 weeks (around 05/11/2020) for colpo w/ me (block LHE schedule at same time).   No orders of the defined types were placed in this encounter.   [place], [facility] 04/20/2020 2:38 PM

## 2020-05-04 ENCOUNTER — Other Ambulatory Visit: Payer: Self-pay | Admitting: Women's Health

## 2020-05-04 MED ORDER — MEGESTROL ACETATE 40 MG PO TABS: ORAL_TABLET | ORAL | 1 refills | Status: DC

## 2020-05-18 ENCOUNTER — Encounter: Payer: BC Managed Care – PPO | Admitting: Women's Health

## 2020-06-02 ENCOUNTER — Encounter: Payer: Self-pay | Admitting: Advanced Practice Midwife

## 2020-06-02 ENCOUNTER — Telehealth (INDEPENDENT_AMBULATORY_CARE_PROVIDER_SITE_OTHER): Payer: BC Managed Care – PPO | Admitting: Advanced Practice Midwife

## 2020-06-02 VITALS — BP 143/98 | HR 90

## 2020-06-02 DIAGNOSIS — I10 Essential (primary) hypertension: Secondary | ICD-10-CM | POA: Diagnosis not present

## 2020-06-02 DIAGNOSIS — F53 Postpartum depression: Secondary | ICD-10-CM | POA: Diagnosis not present

## 2020-06-02 DIAGNOSIS — O99345 Other mental disorders complicating the puerperium: Secondary | ICD-10-CM | POA: Insufficient documentation

## 2020-06-02 MED ORDER — SERTRALINE HCL 25 MG PO TABS: 25.0000 mg | ORAL_TABLET | Freq: Every day | ORAL | 3 refills | Status: DC

## 2020-06-02 NOTE — Progress Notes (Addendum)
[place] VIRTUAL GYN VISIT ENCOUNTER NOTE Patient name: Sherry [name] MRN [id number]  Date of birth: [birth year]  I connected with patient on 06/02/20 at 11:10 AM EDT by MyChart video visit and verified that I am speaking with the correct person using two identifiers.  Due to COVID-19 recommendations, pt is not currently in the office however I am in the office; she is at home.    I discussed the limitations, risks, security and privacy concerns of performing an evaluation and management service by telephone and the availability of in person appointments. I also discussed with the patient that there may be a patient responsible charge related to this service. The patient expressed understanding and agreed to proceed.   Chief Complaint:   Postpartum Care and Depression  History of Present Illness:   Sherry [name] is a 40 y.o. 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 Denies SI/HI, but feels generally 'overwhelmed' and not able to generate the motivation for typical ADLs. Doesn't recall feeling this way during the PP state with her other children. Is open to meds/counseling.    Edinburgh Postnatal Depression Scale Screening Tool 06/02/2020 04/20/2020 03/15/2020 03/14/2020  I have been able to laugh and see the funny side of things. 1 0 0 (No Data)  I have looked forward with enjoyment to things. 1 0 1 -  I have blamed myself unnecessarily when things went wrong. 3 2 2  -  I have been anxious or worried for no good reason. 2 2 2  -  I have felt scared or panicky for no good reason. 3 0 1 -  Things have been getting on top of me. 2 1 1  -  I have been so unhappy that I have had difficulty sleeping. 2 0 0 -  I have felt sad or miserable. 1 0 0 -  I have been so unhappy that I have been crying. 2 1 0 -  The thought of harming myself has occurred to me. 0 0 0 -  Edinburgh Postnatal Depression Scale Total 17 6 7  -     No LMP recorded. Patient has had an implant. The  current method of family planning is Nexplanon.  Last pap July 2021. Results were:  abnormal  +HRHPV with HGSIL- colpo scheduled 3/28 Review of Systems:   Pertinent items are noted in HPI Denies fever/chills, dizziness, headaches, visual disturbances, fatigue, shortness of breath, chest pain, abdominal pain, vomiting, abnormal vaginal discharge/itching/odor/irritation, problems with periods, bowel movements, urination, or intercourse unless otherwise stated above.  Pertinent History Reviewed:  Reviewed past medical,surgical, social, obstetrical and family history.  Reviewed problem list, medications and allergies. Physical Assessment:   Vitals:   06/02/20 1132  BP: (!) 143/98  Pulse: 90  There is no height or weight on file to calculate BMI.       Physical Examination:   General:  Alert, oriented and cooperative.   Mental Status: Normal mood and affect perceived. Normal judgment and thought content.  Physical exam deferred due to nature of the encounter  No results found for this or any previous visit (from the past 24 hour(s)).  Assessment & Plan:  1) PP depression> given resource through www.postpartum.net; has contact at [facility] to call for support/counseling; rx Zoloft and educated re length of time it takes to help  2) Now has cHTN> didn't address this; unsure if she is taking Norvasc still or not  Meds:  Meds ordered this encounter  Medications  .  sertraline (ZOLOFT) 25 MG tablet    Sig: Take 1 tablet (25 mg total) by mouth daily.    Dispense:  30 tablet    Refill:  3    Order Specific Question:   Supervising Provider    Answer:   Elonda Husky, LUTHER H [[id number]]    No orders of the defined types were placed in this encounter.   I discussed the assessment and treatment plan with the patient. The patient was provided an opportunity to ask questions and all were answered. The patient agreed with the plan and demonstrated an understanding of the instructions.   The patient  was advised to call back or seek an in-person evaluation/go to the ED if the symptoms worsen or if the condition fails to improve as anticipated.  I provided 10 minutes of non-face-to-face time during this encounter.   Return for As scheduled for colpo.  Myrtis Ser CNM 06/02/2020 12:09 PM

## 2020-06-03 NOTE — Addendum Note (Signed)
Addended by: Serita Grammes D on: 06/03/2020 02:26 AM   Modules accepted: Level of Service

## 2020-06-10 ENCOUNTER — Encounter: Payer: Self-pay | Admitting: *Deleted

## 2020-06-14 ENCOUNTER — Encounter: Payer: BC Managed Care – PPO | Admitting: Women's Health

## 2021-04-01 ENCOUNTER — Ambulatory Visit: Payer: Medicaid Other | Admitting: Adult Health

## 2021-04-08 ENCOUNTER — Ambulatory Visit: Payer: Medicaid Other | Admitting: Adult Health

## 2021-11-18 ENCOUNTER — Encounter: Payer: Self-pay | Admitting: Adult Health

## 2021-11-18 ENCOUNTER — Other Ambulatory Visit (HOSPITAL_COMMUNITY)
Admission: RE | Admit: 2021-11-18 | Discharge: 2021-11-18 | Disposition: A | Discharge status: Home / Self Care | Payer: Medicaid Other | Source: Ambulatory Visit | Attending: Adult Health | Admitting: Adult Health

## 2021-11-18 ENCOUNTER — Encounter: Payer: Self-pay | Admitting: *Deleted

## 2021-11-18 ENCOUNTER — Ambulatory Visit (INDEPENDENT_AMBULATORY_CARE_PROVIDER_SITE_OTHER): Payer: Medicaid Other | Admitting: Adult Health

## 2021-11-18 VITALS — BP 125/87 | HR 107 | Ht 63.0 in | Wt 190.5 lb

## 2021-11-18 DIAGNOSIS — R1033 Periumbilical pain: Secondary | ICD-10-CM | POA: Insufficient documentation

## 2021-11-18 DIAGNOSIS — Z124 Encounter for screening for malignant neoplasm of cervix: Secondary | ICD-10-CM | POA: Insufficient documentation

## 2021-11-18 DIAGNOSIS — R87613 High grade squamous intraepithelial lesion on cytologic smear of cervix (HGSIL): Secondary | ICD-10-CM | POA: Insufficient documentation

## 2021-11-18 DIAGNOSIS — Z975 Presence of (intrauterine) contraceptive device: Secondary | ICD-10-CM

## 2021-11-18 DIAGNOSIS — K429 Umbilical hernia without obstruction or gangrene: Secondary | ICD-10-CM | POA: Diagnosis not present

## 2021-11-18 NOTE — Progress Notes (Signed)
  Subjective:     Patient ID: Sherry [name], female   DOB: 15-Oct-1980, 41 y.o.   MRN: [id number]  HPI Sherry is a 41 year old black female,married, [id number] in complaining of pain at umbilical hernia for 8 months has gotten worse recently. She needs a pap. Last pap was HSIL,+HPV 10/13/19.  PCP is dr Sherrie Sport.  Review of Systems Has pain at umbilical hernia for 8 months, has gotten worse Reviewed past medical,surgical, social and family history. Reviewed medications and allergies.     Objective:   Physical Exam BP 125/87 (BP Location: Left Arm, Patient Position: Sitting, Cuff Size: Normal)   Pulse (!) 107   Ht '5\' 3"'$  (1.6 m)   Wt 190 lb 8 oz (86.4 kg)   LMP 10/18/2021   Breastfeeding No   BMI 33.75 kg/m     Skin warm and dry.  Lungs: clear to ausculation bilaterally. Cardiovascular: regular rate and rhythm.  Abdomen is soft, has small umbilical hernia that is reducible, can feel it slide back. No skin changes but tender when out.  Pelvic: external genitalia is normal in appearance no lesions, vagina: pink,urethra has no lesions or masses noted, cervix:smooth and bulbous,Pap with GC/CHL and HR HPV genotyping performed, uterus: normal size, shape and contour, non tender, no masses felt, adnexa: no masses or tenderness noted. Bladder is non tender and no masses felt.  Fall risk is low  Upstream - 11/18/21 0942       Pregnancy Intention Screening   Does the patient want to become pregnant in the next year? No    Does the patient's partner want to become pregnant in the next year? No    Would the patient like to discuss contraceptive options today? No      Contraception Wrap Up   Current Method Hormonal Implant    End Method Hormonal Implant            Examination chaperoned by Levy Pupa LPN  Assessment:     1. Umbilical hernia without obstruction and without gangrene Referred to Dr Kandice Hams office will call her for appt 11/23/21  - Ambulatory referral to General  Surgery Review handout on umbilical hernia If has severe pain, go to ER   2. Routine Papanicolaou smear Pap sent Pap in 3 years if normal - Cytology - PAP( [place])  3. High grade squamous intraepithelial lesion (HGSIL) on cytologic smear of cervix Pap sent  - Cytology - PAP( [place])  4. Periumbilical abdominal pain Referred to Dr Arnoldo Morale  - Ambulatory referral to General Surgery  5. Nexplanon in place Was placed 03/15/20 after deliver    Plan:     Physical in 1 year

## 2021-11-23 ENCOUNTER — Encounter: Payer: Self-pay | Admitting: General Surgery

## 2021-11-23 ENCOUNTER — Ambulatory Visit (INDEPENDENT_AMBULATORY_CARE_PROVIDER_SITE_OTHER): Payer: Medicaid Other | Admitting: General Surgery

## 2021-11-23 VITALS — BP 123/85 | HR 105 | Temp 99.3°F | Resp 14 | Ht 63.0 in | Wt 188.0 lb

## 2021-11-23 DIAGNOSIS — K429 Umbilical hernia without obstruction or gangrene: Secondary | ICD-10-CM | POA: Diagnosis not present

## 2021-11-23 NOTE — Progress Notes (Signed)
Sherry [name]; [id number]; Aug 23, 1980   HPI Patient is a 41 year old black female who was referred to my care by Derrek Monaco for evaluation and treatment of an umbilical hernia.  She has had an umbilical hernia over the past 9 months after having a baby.  It developed during her pregnancy.  It is made worse with straining.  It is reducible. 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   Vaginal Pap smear, abnormal    HGSIL +HRHPV    Past Surgical History:  Procedure Laterality Date   BIOPSY SALIVARY GLAND     port acath     TONSILLECTOMY      Family History  Problem Relation Age of Onset   Diabetes Paternal Grandmother    Hypertension Paternal Grandmother    Arthritis Maternal Grandmother    Cancer Maternal Grandmother        breast   Diabetes Maternal Grandmother    Hypertension Maternal Grandmother    Alcohol abuse Maternal Grandfather    Diabetes Maternal Aunt    Hypertension Maternal Aunt    Cancer Maternal Uncle        brain   Cancer Maternal Uncle        brain   Miscarriages / [place] Cousin     Current Outpatient Medications on File Prior to Visit  Medication Sig Dispense Refill   etonogestrel (NEXPLANON) 68 MG IMPL implant 1 each by Subdermal route once.     IBUPROFEN PO Take by mouth as needed.     No current facility-administered medications on file prior to visit.    Allergies  Allergen Reactions   Cefprozil Hives   Zithromax [Azithromycin] Nausea And Vomiting    Social History   Substance and Sexual Activity  Alcohol Use Not Currently   Comment: occassional    Social History   Tobacco Use  Smoking Status Every Day   Packs/day: 0.50   Years: 10.00   Total pack years: 5.00   Types: Cigarettes  Smokeless Tobacco Never    Review of Systems  Constitutional: Negative.   HENT: Negative.    Eyes: Negative.   Respiratory: Negative.     Cardiovascular: Negative.   Gastrointestinal:  Positive for abdominal pain.  Genitourinary: Negative.   Musculoskeletal: Negative.   Skin: Negative.   Neurological: Negative.   Endo/Heme/Allergies: Negative.   Psychiatric/Behavioral: Negative.      Objective   Vitals:   11/23/21 1503  BP: 123/85  Pulse: (!) 105  Resp: 14  Temp: 99.3 F (37.4 C)  SpO2: 98%    Physical Exam Vitals reviewed.  Constitutional:      Appearance: Normal appearance. She is not ill-appearing.  HENT:     Head: Normocephalic.  Cardiovascular:     Rate and Rhythm: Normal rate and regular rhythm.     Heart sounds: Normal heart sounds. No murmur heard.    No friction rub. No gallop.  Pulmonary:     Effort: Pulmonary effort is normal. No respiratory distress.     Breath sounds: Normal breath sounds. No stridor. No wheezing, rhonchi or rales.  Abdominal:     General: Bowel sounds are normal. There is no distension.     Palpations: Abdomen is soft. There is no mass.     Tenderness: There is no abdominal tenderness. There is no guarding or rebound.     Hernia: A hernia is  present.     Comments: Reducible less than 3 cm umbilical hernia.  Skin:    General: Skin is warm and dry.  Neurological:     Mental Status: She is alert and oriented to person, place, and time.   GYN notes reviewed  Assessment  Umbilical hernia Plan  Patient is scheduled for an umbilical herniorrhaphy with mesh on 12/16/2021.  The risks and benefits of the procedure including bleeding, infection, mesh use, and the possibility of recurrence of the hernia were fully explained to the patient, who gave informed consent.

## 2021-11-23 NOTE — H&P (Signed)
Sherry [name]; [id number]; 1980/09/29   HPI Patient is a 41 year old black female who was referred to my care by Derrek Monaco for evaluation and treatment of an umbilical hernia.  She has had an umbilical hernia over the past 9 months after having a baby.  It developed during her pregnancy.  It is made worse with straining.  It is reducible. 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   Vaginal Pap smear, abnormal    HGSIL +HRHPV    Past Surgical History:  Procedure Laterality Date   BIOPSY SALIVARY GLAND     port acath     TONSILLECTOMY      Family History  Problem Relation Age of Onset   Diabetes Paternal Grandmother    Hypertension Paternal Grandmother    Arthritis Maternal Grandmother    Cancer Maternal Grandmother        breast   Diabetes Maternal Grandmother    Hypertension Maternal Grandmother    Alcohol abuse Maternal Grandfather    Diabetes Maternal Aunt    Hypertension Maternal Aunt    Cancer Maternal Uncle        brain   Cancer Maternal Uncle        brain   Miscarriages / [place] Cousin     Current Outpatient Medications on File Prior to Visit  Medication Sig Dispense Refill   etonogestrel (NEXPLANON) 68 MG IMPL implant 1 each by Subdermal route once.     IBUPROFEN PO Take by mouth as needed.     No current facility-administered medications on file prior to visit.    Allergies  Allergen Reactions   Cefprozil Hives   Zithromax [Azithromycin] Nausea And Vomiting    Social History   Substance and Sexual Activity  Alcohol Use Not Currently   Comment: occassional    Social History   Tobacco Use  Smoking Status Every Day   Packs/day: 0.50   Years: 10.00   Total pack years: 5.00   Types: Cigarettes  Smokeless Tobacco Never    Review of Systems  Constitutional: Negative.   HENT: Negative.    Eyes: Negative.   Respiratory: Negative.     Cardiovascular: Negative.   Gastrointestinal:  Positive for abdominal pain.  Genitourinary: Negative.   Musculoskeletal: Negative.   Skin: Negative.   Neurological: Negative.   Endo/Heme/Allergies: Negative.   Psychiatric/Behavioral: Negative.      Objective   Vitals:   11/23/21 1503  BP: 123/85  Pulse: (!) 105  Resp: 14  Temp: 99.3 F (37.4 C)  SpO2: 98%    Physical Exam Vitals reviewed.  Constitutional:      Appearance: Normal appearance. She is not ill-appearing.  HENT:     Head: Normocephalic.  Cardiovascular:     Rate and Rhythm: Normal rate and regular rhythm.     Heart sounds: Normal heart sounds. No murmur heard.    No friction rub. No gallop.  Pulmonary:     Effort: Pulmonary effort is normal. No respiratory distress.     Breath sounds: Normal breath sounds. No stridor. No wheezing, rhonchi or rales.  Abdominal:     General: Bowel sounds are normal. There is no distension.     Palpations: Abdomen is soft. There is no mass.     Tenderness: There is no abdominal tenderness. There is no guarding or rebound.     Hernia: A hernia is  present.     Comments: Reducible less than 3 cm umbilical hernia.  Skin:    General: Skin is warm and dry.  Neurological:     Mental Status: She is alert and oriented to person, place, and time.   GYN notes reviewed  Assessment  Umbilical hernia Plan  Patient is scheduled for an umbilical herniorrhaphy with mesh on 12/16/2021.  The risks and benefits of the procedure including bleeding, infection, mesh use, and the possibility of recurrence of the hernia were fully explained to the patient, who gave informed consent.

## 2021-11-25 LAB — CYTOLOGY - PAP
Chlamydia: NEGATIVE
Comment: NEGATIVE
Comment: NEGATIVE
Comment: NEGATIVE
Comment: NEGATIVE
Comment: NORMAL
Diagnosis: HIGH — AB
HPV 16: NEGATIVE
HPV 18 / 45: NEGATIVE
High risk HPV: POSITIVE — AB
Neisseria Gonorrhea: NEGATIVE

## 2021-11-28 ENCOUNTER — Telehealth: Payer: Self-pay | Admitting: Adult Health

## 2021-11-28 NOTE — Telephone Encounter (Signed)
Left message that pap was abnormal. Please call the office for colposcopy appt with Dr Nelda Marseille.

## 2021-12-09 ENCOUNTER — Telehealth: Payer: Self-pay | Admitting: *Deleted

## 2021-12-09 NOTE — Telephone Encounter (Signed)
Received call from patient [phone]~ telephone.  Procedure: 84720- Umbilical Hernia Repair W/ Mesh Dx: T21.8- Umbilical Hernia, <3 cm  Requested to change date of surgery due to scheduling contradiction with her job. States that she is unable to take leave at this time. Requested to move surgery to end of October.   Sherry [name] reschedule surgery to 01/13/2022?

## 2021-12-12 NOTE — Telephone Encounter (Signed)
Surgery scheduled. PAT scheduled.   Call placed to patient. [place].

## 2021-12-13 NOTE — Telephone Encounter (Signed)
Call placed to patient and patient made aware.  

## 2021-12-14 ENCOUNTER — Encounter (HOSPITAL_COMMUNITY): Payer: Medicaid Other

## 2021-12-16 DIAGNOSIS — Z01818 Encounter for other preprocedural examination: Secondary | ICD-10-CM

## 2021-12-23 ENCOUNTER — Encounter: Payer: Medicaid Other | Admitting: Obstetrics & Gynecology

## 2022-01-09 NOTE — Patient Instructions (Signed)
Sherry [name]  01/09/2022     '@PREFPERIOPPHARMACY'$ @   Your procedure is scheduled on  01/13/2022.   Report to [facility] at  0600  A.M.   Call this number if you have problems the morning of surgery:  [phone]  If you experience any cold or flu symptoms such as cough, fever, chills, shortness of breath, etc. between now and your scheduled surgery, please notify us at the above number.   Remember:  Do not eat or drink after midnight.      Take these medicines the morning of surgery with A SIP OF WATER                                               None.    Do not wear jewelry, make-up or nail polish.  Do not wear lotions, powders, or perfumes, or deodorant.  Do not shave 48 hours prior to surgery.  Men may shave face and neck.  Do not bring valuables to the hospital.  [facility] is not responsible for any belongings or valuables.  Contacts, dentures or bridgework may not be worn into surgery.  Leave your suitcase in the car.  After surgery it may be brought to your room.  For patients admitted to the hospital, discharge time will be determined by your treatment team.  Patients discharged the day of surgery will not be allowed to drive home and must have someone with them for 24 hours.     Special instructions:   DO NOT smoke tobacco or vape for 24 hours before your procedure.  Please read over the following fact sheets that you were given. Coughing and Deep Breathing, Surgical Site Infection Prevention, Anesthesia Post-op Instructions, and Care and Recovery After Surgery      Open Hernia Repair, Adult, Care After What can I expect after the procedure? After the procedure, it is common to have: Mild discomfort. Slight bruising. Mild swelling. Pain in the belly (abdomen). A small amount of blood from the cut from surgery (incision). Follow these instructions at home: Your doctor may give you more specific instructions. If you have problems, call  your doctor. Medicines Take over-the-counter and prescription medicines only as told by your doctor. If told, take steps to prevent problems with pooping (constipation). You may need to: Drink enough fluid to keep your pee (urine) pale yellow. Take medicines. You will be told what medicines to take. Eat foods that are high in fiber. These include beans, whole grains, and fresh fruits and vegetables. Limit foods that are high in fat and sugar. These include fried or sweet foods. Ask your doctor if you should avoid driving or using machines while you are taking your medicine. Incision care  Follow instructions from your doctor about how to take care of your incision. Make sure you: Wash your hands with soap and water for at least 20 seconds before and after you change your bandage (dressing). If you cannot use soap and water, use hand sanitizer. Change your bandage. Leave stitches or skin glue in place for at least 2 weeks. Leave tape strips alone unless you are told to take them off. You may trim the edges of the tape strips if they curl up. Check your incision every day for signs of infection. Check for: More redness, swelling, or pain.  More fluid or blood. Warmth. Pus or a bad smell. Wear loose, soft clothing while your incision heals. Activity  Rest as told by your doctor. Do not lift anything that is heavier than 10 lb (4.5 kg), or the limit that you are told. Do not play contact sports until your doctor says that this is safe. If you were given a sedative during your procedure, do not drive or use machines until your doctor says that it is safe. A sedative is a medicine that helps you relax. Return to your normal activities when your doctor says that it is safe. General instructions Do not take baths, swim, or use a hot tub. Ask your doctor about taking showers or sponge baths. Hold a pillow over your belly when you cough or sneeze. This helps with pain. Do not smoke or use any  products that contain nicotine or tobacco. If you need help quitting, ask your doctor. Keep all follow-up visits. Contact a doctor if: You have any of these signs of infection in or around your incision: More redness, swelling, or pain. More fluid or blood. Warmth. Pus. A bad smell. You have a fever or chills. You have blood in your poop (stool). You have not pooped (had a bowel movement) in 2-3 days. Medicine does not help your pain. Get help right away if: You have chest pain, or you are short of breath. You feel faint or light-headed. You have very bad pain. You vomit and your pain is worse. You have pain, swelling, or redness in a leg. These symptoms may be an emergency. Get help right away. Call your local emergency services (911 in the [place] Do not wait to see if the symptoms will go away. Do not drive yourself to the hospital. Summary After this procedure, it is common to have mild discomfort, slight bruising, and mild swelling. Follow instructions from your doctor about how to take care of your cut from surgery (incision). Check every day for signs of infection. Do not lift heavy objects or play contact sports until your doctor says it is safe. Return to your normal activities as told by your doctor. This information is not intended to replace advice given to you by your health care provider. Make sure you discuss any questions you have with your health care provider. Document Revised: 10/20/2019 Document Reviewed: 10/20/2019 Elsevier Patient Education  [place] Anesthesia, Adult, Care After The following information offers guidance on how to care for yourself after your procedure. Your health care provider may also give you more specific instructions. If you have problems or questions, contact your health care provider. What can I expect after the procedure? After the procedure, it is common for people to: Have pain or discomfort at the IV site. Have  nausea or vomiting. Have a sore throat or hoarseness. Have trouble concentrating. Feel cold or chills. Feel weak, sleepy, or tired (fatigue). Have soreness and body aches. These can affect parts of the body that were not involved in surgery. Follow these instructions at home: For the time period you were told by your health care provider:  Rest. Do not participate in activities where you could fall or become injured. Do not drive or use machinery. Do not drink alcohol. Do not take sleeping pills or medicines that cause drowsiness. Do not make important decisions or sign legal documents. Do not take care of children on your own. General instructions Drink enough fluid to keep your urine pale yellow. If you have  sleep apnea, surgery and certain medicines can increase your risk for breathing problems. Follow instructions from your health care provider about wearing your sleep device: Anytime you are sleeping, including during daytime naps. While taking prescription pain medicines, sleeping medicines, or medicines that make you drowsy. Return to your normal activities as told by your health care provider. Ask your health care provider what activities are safe for you. Take over-the-counter and prescription medicines only as told by your health care provider. Do not use any products that contain nicotine or tobacco. These products include cigarettes, chewing tobacco, and vaping devices, such as e-cigarettes. These can delay incision healing after surgery. If you need help quitting, ask your health care provider. Contact a health care provider if: You have nausea or vomiting that does not get better with medicine. You vomit every time you eat or drink. You have pain that does not get better with medicine. You cannot urinate or have bloody urine. You develop a skin rash. You have a fever. Get help right away if: You have trouble breathing. You have chest pain. You vomit blood. These  symptoms may be an emergency. Get help right away. Call 911. Do not wait to see if the symptoms will go away. Do not drive yourself to the hospital. Summary After the procedure, it is common to have a sore throat, hoarseness, nausea, vomiting, or to feel weak, sleepy, or fatigue. For the time period you were told by your health care provider, do not drive or use machinery. Get help right away if you have difficulty breathing, have chest pain, or vomit blood. These symptoms may be an emergency. This information is not intended to replace advice given to you by your health care provider. Make sure you discuss any questions you have with your health care provider. Document Revised: 06/03/2021 Document Reviewed: 06/03/2021 Elsevier Patient Education  [place]. How to Use Chlorhexidine Before Surgery Chlorhexidine gluconate (CHG) is a germ-killing (antiseptic) solution that is used to clean the skin. It can get rid of the bacteria that normally live on the skin and can keep them away for about 24 hours. To clean your skin with CHG, you may be given: A CHG solution to use in the shower or as part of a sponge bath. A prepackaged cloth that contains CHG. Cleaning your skin with CHG may help lower the risk for infection: While you are staying in the intensive care unit of the hospital. If you have a vascular access, such as a central line, to provide short-term or long-term access to your veins. If you have a catheter to drain urine from your bladder. If you are on a ventilator. A ventilator is a machine that helps you breathe by moving air in and out of your lungs. After surgery. What are the risks? Risks of using CHG include: A skin reaction. Hearing loss, if CHG gets in your ears and you have a perforated eardrum. Eye injury, if CHG gets in your eyes and is not rinsed out. The CHG product catching fire. Make sure that you avoid smoking and flames after applying CHG to your skin. Do not  use CHG: If you have a chlorhexidine allergy or have previously reacted to chlorhexidine. On babies younger than 20 months of age. How to use CHG solution Use CHG only as told by your health care provider, and follow the instructions on the label. Use the full amount of CHG as directed. Usually, this is one bottle. During a shower Follow  these steps when using CHG solution during a shower (unless your health care provider gives you different instructions): Start the shower. Use your normal soap and shampoo to wash your face and hair. Turn off the shower or move out of the shower stream. Pour the CHG onto a clean washcloth. Do not use any type of brush or rough-edged sponge. Starting at your neck, lather your body down to your toes. Make sure you follow these instructions: If you will be having surgery, pay special attention to the part of your body where you will be having surgery. Scrub this area for at least 1 minute. Do not use CHG on your head or face. If the solution gets into your ears or eyes, rinse them well with water. Avoid your genital area. Avoid any areas of skin that have broken skin, cuts, or scrapes. Scrub your back and under your arms. Make sure to wash skin folds. Let the lather sit on your skin for 1-2 minutes or as long as told by your health care provider. Thoroughly rinse your entire body in the shower. Make sure that all body creases and crevices are rinsed well. Dry off with a clean towel. Do not put any substances on your body afterward--such as powder, lotion, or perfume--unless you are told to do so by your health care provider. Only use lotions that are recommended by the manufacturer. Put on clean clothes or pajamas. If it is the night before your surgery, sleep in clean sheets.  During a sponge bath Follow these steps when using CHG solution during a sponge bath (unless your health care provider gives you different instructions): Use your normal soap and shampoo  to wash your face and hair. Pour the CHG onto a clean washcloth. Starting at your neck, lather your body down to your toes. Make sure you follow these instructions: If you will be having surgery, pay special attention to the part of your body where you will be having surgery. Scrub this area for at least 1 minute. Do not use CHG on your head or face. If the solution gets into your ears or eyes, rinse them well with water. Avoid your genital area. Avoid any areas of skin that have broken skin, cuts, or scrapes. Scrub your back and under your arms. Make sure to wash skin folds. Let the lather sit on your skin for 1-2 minutes or as long as told by your health care provider. Using a different clean, wet washcloth, thoroughly rinse your entire body. Make sure that all body creases and crevices are rinsed well. Dry off with a clean towel. Do not put any substances on your body afterward--such as powder, lotion, or perfume--unless you are told to do so by your health care provider. Only use lotions that are recommended by the manufacturer. Put on clean clothes or pajamas. If it is the night before your surgery, sleep in clean sheets. How to use CHG prepackaged cloths Only use CHG cloths as told by your health care provider, and follow the instructions on the label. Use the CHG cloth on clean, dry skin. Do not use the CHG cloth on your head or face unless your health care provider tells you to. When washing with the CHG cloth: Avoid your genital area. Avoid any areas of skin that have broken skin, cuts, or scrapes. Before surgery Follow these steps when using a CHG cloth to clean before surgery (unless your health care provider gives you different instructions): Using the CHG cloth, vigorously  scrub the part of your body where you will be having surgery. Scrub using a back-and-forth motion for 3 minutes. The area on your body should be completely wet with CHG when you are done scrubbing. Do not rinse.  Discard the cloth and let the area air-dry. Do not put any substances on the area afterward, such as powder, lotion, or perfume. Put on clean clothes or pajamas. If it is the night before your surgery, sleep in clean sheets.  For general bathing Follow these steps when using CHG cloths for general bathing (unless your health care provider gives you different instructions). Use a separate CHG cloth for each area of your body. Make sure you wash between any folds of skin and between your fingers and toes. Wash your body in the following order, switching to a new cloth after each step: The front of your neck, shoulders, and chest. Both of your arms, under your arms, and your hands. Your stomach and groin area, avoiding the genitals. Your right leg and foot. Your left leg and foot. The back of your neck, your back, and your buttocks. Do not rinse. Discard the cloth and let the area air-dry. Do not put any substances on your body afterward--such as powder, lotion, or perfume--unless you are told to do so by your health care provider. Only use lotions that are recommended by the manufacturer. Put on clean clothes or pajamas. Contact a health care provider if: Your skin gets irritated after scrubbing. You have questions about using your solution or cloth. You swallow any chlorhexidine. Call your local poison control center (1-(562)809-2925 in the [place] Get help right away if: Your eyes itch badly, or they become very red or swollen. Your skin itches badly and is red or swollen. Your hearing changes. You have trouble seeing. You have swelling or tingling in your mouth or throat. You have trouble breathing. These symptoms may represent a serious problem that is an emergency. Do not wait to see if the symptoms will go away. Get medical help right away. Call your local emergency services (911 in the [place] Do not drive yourself to the hospital. Summary Chlorhexidine gluconate (CHG) is a germ-killing  (antiseptic) solution that is used to clean the skin. Cleaning your skin with CHG may help to lower your risk for infection. You may be given CHG to use for bathing. It may be in a bottle or in a prepackaged cloth to use on your skin. Carefully follow your health care provider's instructions and the instructions on the product label. Do not use CHG if you have a chlorhexidine allergy. Contact your health care provider if your skin gets irritated after scrubbing. This information is not intended to replace advice given to you by your health care provider. Make sure you discuss any questions you have with your health care provider. Document Revised: 07/04/2021 Document Reviewed: 05/17/2020 Elsevier Patient Education  [place].

## 2022-01-10 ENCOUNTER — Encounter (HOSPITAL_COMMUNITY): Payer: Self-pay

## 2022-01-10 ENCOUNTER — Encounter (HOSPITAL_COMMUNITY)
Admission: RE | Admit: 2022-01-10 | Discharge: 2022-01-10 | Disposition: A | Discharge status: Home / Self Care | Payer: Medicaid Other | Source: Ambulatory Visit | Attending: General Surgery | Admitting: General Surgery

## 2022-01-10 VITALS — BP 120/78 | HR 93 | Temp 97.7°F | Resp 18 | Ht 63.0 in | Wt 188.1 lb

## 2022-01-10 DIAGNOSIS — Z01818 Encounter for other preprocedural examination: Secondary | ICD-10-CM | POA: Diagnosis present

## 2022-01-10 LAB — POCT PREGNANCY, URINE: Preg Test, Ur: NEGATIVE

## 2022-01-13 ENCOUNTER — Ambulatory Visit (HOSPITAL_BASED_OUTPATIENT_CLINIC_OR_DEPARTMENT_OTHER): Payer: Medicaid Other | Admitting: Anesthesiology

## 2022-01-13 ENCOUNTER — Other Ambulatory Visit: Payer: Self-pay

## 2022-01-13 ENCOUNTER — Encounter (HOSPITAL_COMMUNITY): Payer: Self-pay | Admitting: General Surgery

## 2022-01-13 ENCOUNTER — Ambulatory Visit (HOSPITAL_COMMUNITY): Payer: Medicaid Other | Admitting: Anesthesiology

## 2022-01-13 ENCOUNTER — Ambulatory Visit (HOSPITAL_COMMUNITY)
Admission: RE | Admit: 2022-01-13 | Discharge: 2022-01-13 | Disposition: A | Discharge status: Home / Self Care | Payer: Medicaid Other | Attending: General Surgery | Admitting: General Surgery

## 2022-01-13 ENCOUNTER — Encounter (HOSPITAL_COMMUNITY)
Admission: RE | Disposition: A | Discharge status: Home / Self Care | Payer: Self-pay | Source: Home / Self Care | Attending: General Surgery

## 2022-01-13 DIAGNOSIS — Z01818 Encounter for other preprocedural examination: Secondary | ICD-10-CM

## 2022-01-13 DIAGNOSIS — I10 Essential (primary) hypertension: Secondary | ICD-10-CM | POA: Diagnosis not present

## 2022-01-13 DIAGNOSIS — F17210 Nicotine dependence, cigarettes, uncomplicated: Secondary | ICD-10-CM | POA: Diagnosis not present

## 2022-01-13 DIAGNOSIS — K429 Umbilical hernia without obstruction or gangrene: Secondary | ICD-10-CM | POA: Insufficient documentation

## 2022-01-13 DIAGNOSIS — Z8249 Family history of ischemic heart disease and other diseases of the circulatory system: Secondary | ICD-10-CM | POA: Insufficient documentation

## 2022-01-13 HISTORY — PX: UMBILICAL HERNIA REPAIR: SHX196

## 2022-01-13 SURGERY — REPAIR, HERNIA, UMBILICAL, ADULT
Anesthesia: General | Site: Abdomen

## 2022-01-13 MED ORDER — FENTANYL CITRATE (PF) 100 MCG/2ML IJ SOLN
INTRAMUSCULAR | Status: DC | PRN
  Administered 2022-01-13 (×2): 50 ug via INTRAVENOUS

## 2022-01-13 MED ORDER — MIDAZOLAM HCL 2 MG/2ML IJ SOLN
INTRAMUSCULAR | Status: AC
  Filled 2022-01-13: qty 2

## 2022-01-13 MED ORDER — CHLORHEXIDINE GLUCONATE CLOTH 2 % EX PADS: 6.0000 | MEDICATED_PAD | Freq: Once | CUTANEOUS | Status: DC

## 2022-01-13 MED ORDER — ROCURONIUM BROMIDE 10 MG/ML (PF) SYRINGE
PREFILLED_SYRINGE | INTRAVENOUS | Status: DC | PRN
  Administered 2022-01-13: 50 mg via INTRAVENOUS

## 2022-01-13 MED ORDER — ORAL CARE MOUTH RINSE: 15.0000 mL | Freq: Once | OROMUCOSAL | Status: AC

## 2022-01-13 MED ORDER — OXYCODONE HCL 5 MG/5ML PO SOLN: 5.0000 mg | Freq: Once | ORAL | Status: DC | PRN

## 2022-01-13 MED ORDER — FENTANYL CITRATE PF 50 MCG/ML IJ SOSY
25.0000 ug | PREFILLED_SYRINGE | INTRAMUSCULAR | Status: DC | PRN
  Administered 2022-01-13 (×2): 50 ug via INTRAVENOUS
  Filled 2022-01-13 (×2): qty 1

## 2022-01-13 MED ORDER — ESMOLOL HCL 100 MG/10ML IV SOLN
INTRAVENOUS | Status: DC | PRN
  Administered 2022-01-13: 20 mg via INTRAVENOUS

## 2022-01-13 MED ORDER — LACTATED RINGERS IV SOLN
INTRAVENOUS | Status: DC
  Administered 2022-01-13: 1000 mL via INTRAVENOUS

## 2022-01-13 MED ORDER — ONDANSETRON HCL 4 MG/2ML IJ SOLN
INTRAMUSCULAR | Status: DC | PRN
  Administered 2022-01-13: 4 mg via INTRAVENOUS

## 2022-01-13 MED ORDER — LIDOCAINE HCL (PF) 2 % IJ SOLN
INTRAMUSCULAR | Status: AC
  Filled 2022-01-13: qty 5

## 2022-01-13 MED ORDER — KETOROLAC TROMETHAMINE 30 MG/ML IJ SOLN
INTRAMUSCULAR | Status: DC | PRN
  Administered 2022-01-13: 30 mg via INTRAVENOUS

## 2022-01-13 MED ORDER — VANCOMYCIN HCL IN DEXTROSE 1-5 GM/200ML-% IV SOLN
1000.0000 mg | INTRAVENOUS | Status: AC
  Administered 2022-01-13: 1000 mg via INTRAVENOUS
  Filled 2022-01-13: qty 200

## 2022-01-13 MED ORDER — DEXAMETHASONE SODIUM PHOSPHATE 4 MG/ML IJ SOLN
INTRAMUSCULAR | Status: DC | PRN
  Administered 2022-01-13: 10 mg via INTRAVENOUS

## 2022-01-13 MED ORDER — ROCURONIUM BROMIDE 10 MG/ML (PF) SYRINGE
PREFILLED_SYRINGE | INTRAVENOUS | Status: AC
  Filled 2022-01-13: qty 10

## 2022-01-13 MED ORDER — CHLORHEXIDINE GLUCONATE 0.12 % MT SOLN
15.0000 mL | Freq: Once | OROMUCOSAL | Status: AC
  Administered 2022-01-13: 15 mL via OROMUCOSAL

## 2022-01-13 MED ORDER — ONDANSETRON HCL 4 MG/2ML IJ SOLN: 4.0000 mg | Freq: Once | INTRAMUSCULAR | Status: DC | PRN

## 2022-01-13 MED ORDER — LIDOCAINE HCL (CARDIAC) PF 100 MG/5ML IV SOSY
PREFILLED_SYRINGE | INTRAVENOUS | Status: DC | PRN
  Administered 2022-01-13: 80 mg via INTRAVENOUS

## 2022-01-13 MED ORDER — SODIUM CHLORIDE 0.9 % IR SOLN
Status: DC | PRN
  Administered 2022-01-13: 1000 mL

## 2022-01-13 MED ORDER — PROPOFOL 10 MG/ML IV BOLUS
INTRAVENOUS | Status: AC
  Filled 2022-01-13: qty 20

## 2022-01-13 MED ORDER — MIDAZOLAM HCL 5 MG/5ML IJ SOLN
INTRAMUSCULAR | Status: DC | PRN
  Administered 2022-01-13: 2 mg via INTRAVENOUS

## 2022-01-13 MED ORDER — DEXAMETHASONE SODIUM PHOSPHATE 10 MG/ML IJ SOLN
INTRAMUSCULAR | Status: AC
  Filled 2022-01-13: qty 1

## 2022-01-13 MED ORDER — SUGAMMADEX SODIUM 200 MG/2ML IV SOLN
INTRAVENOUS | Status: DC | PRN
  Administered 2022-01-13: 200 mg via INTRAVENOUS

## 2022-01-13 MED ORDER — PROPOFOL 10 MG/ML IV BOLUS
INTRAVENOUS | Status: DC | PRN
  Administered 2022-01-13: 200 mg via INTRAVENOUS
  Administered 2022-01-13: 70 mg via INTRAVENOUS

## 2022-01-13 MED ORDER — FENTANYL CITRATE (PF) 100 MCG/2ML IJ SOLN
INTRAMUSCULAR | Status: AC
  Filled 2022-01-13: qty 2

## 2022-01-13 MED ORDER — OXYCODONE HCL 5 MG PO TABS: 5.0000 mg | ORAL_TABLET | Freq: Once | ORAL | Status: DC | PRN

## 2022-01-13 MED ORDER — BUPIVACAINE LIPOSOME 1.3 % IJ SUSP
INTRAMUSCULAR | Status: DC | PRN
  Administered 2022-01-13: 20 mL

## 2022-01-13 MED ORDER — BUPIVACAINE LIPOSOME 1.3 % IJ SUSP
INTRAMUSCULAR | Status: AC
  Filled 2022-01-13: qty 20

## 2022-01-13 MED ORDER — HYDROCODONE-ACETAMINOPHEN 5-325 MG PO TABS: 1.0000 | ORAL_TABLET | ORAL | 0 refills | Status: DC | PRN

## 2022-01-13 MED ORDER — ONDANSETRON HCL 4 MG/2ML IJ SOLN
INTRAMUSCULAR | Status: AC
  Filled 2022-01-13: qty 2

## 2022-01-13 SURGICAL SUPPLY — 31 items
ADH SKN CLS APL DERMABOND .7 (GAUZE/BANDAGES/DRESSINGS) ×1
APL PRP STRL LF DISP 70% ISPRP (MISCELLANEOUS) ×1
BLADE SURG SZ11 CARB STEEL (BLADE) ×1 IMPLANT
CHLORAPREP W/TINT 26 (MISCELLANEOUS) ×1 IMPLANT
CLOTH BEACON ORANGE TIMEOUT ST (SAFETY) ×1 IMPLANT
COVER LIGHT HANDLE STERIS (MISCELLANEOUS) ×2 IMPLANT
DERMABOND ADVANCED .7 DNX12 (GAUZE/BANDAGES/DRESSINGS) ×1 IMPLANT
ELECT REM PT RETURN 9FT ADLT (ELECTROSURGICAL) ×1
ELECTRODE REM PT RTRN 9FT ADLT (ELECTROSURGICAL) ×1 IMPLANT
GLOVE BIOGEL PI IND STRL 7.0 (GLOVE) ×2 IMPLANT
GLOVE ECLIPSE 6.5 STRL STRAW (GLOVE) IMPLANT
GLOVE SURG SS PI 6.5 STRL IVOR (GLOVE) IMPLANT
GLOVE SURG SS PI 7.5 STRL IVOR (GLOVE) ×1 IMPLANT
GOWN STRL REUS W/TWL LRG LVL3 (GOWN DISPOSABLE) ×2 IMPLANT
INST SET MINOR GENERAL (KITS) ×1 IMPLANT
KIT TURNOVER KIT A (KITS) ×1 IMPLANT
LIGASURE IMPACT 36 18CM CVD LR (INSTRUMENTS) IMPLANT
MANIFOLD NEPTUNE II (INSTRUMENTS) ×1 IMPLANT
MESH VENTRALEX ST 1-7/10 CRC S (Mesh General) IMPLANT
NDL HYPO 21X1.5 SAFETY (NEEDLE) ×1 IMPLANT
NEEDLE HYPO 21X1.5 SAFETY (NEEDLE) ×1 IMPLANT
NS IRRIG 1000ML POUR BTL (IV SOLUTION) ×1 IMPLANT
PACK MINOR (CUSTOM PROCEDURE TRAY) ×1 IMPLANT
PAD ARMBOARD 7.5X6 YLW CONV (MISCELLANEOUS) ×1 IMPLANT
SET BASIN LINEN APH (SET/KITS/TRAYS/PACK) ×1 IMPLANT
SUT ETHIBOND NAB MO 7 #0 18IN (SUTURE) ×1 IMPLANT
SUT MNCRL AB 4-0 PS2 18 (SUTURE) ×1 IMPLANT
SUT VIC AB 2-0 CT2 27 (SUTURE) ×1 IMPLANT
SUT VIC AB 3-0 SH 27 (SUTURE) ×1
SUT VIC AB 3-0 SH 27X BRD (SUTURE) ×1 IMPLANT
SYR 20ML LL LF (SYRINGE) ×2 IMPLANT

## 2022-01-13 NOTE — Anesthesia Procedure Notes (Signed)
Procedure Name: LMA Insertion Date/Time: 01/13/2022 7:41 AM  Performed by: Lieutenant Diego, CRNAPre-anesthesia Checklist: Patient identified, Emergency Drugs available, Suction available and Patient being monitored Patient Re-evaluated:Patient Re-evaluated prior to induction Oxygen Delivery Method: Circle system utilized Preoxygenation: Pre-oxygenation with 100% oxygen Induction Type: IV induction Ventilation: Mask ventilation without difficulty LMA: LMA inserted LMA Size: 4.0 Number of attempts: 1 Placement Confirmation: positive ETCO2 and breath sounds checked- equal and bilateral Tube secured with: Tape Dental Injury: Teeth and Oropharynx as per pre-operative assessment

## 2022-01-13 NOTE — Op Note (Signed)
Patient:  Sherry [name]  DOB:  [birth year]  MRN:  [id number]   Preop Diagnosis: Umbilical hernia  Postop Diagnosis: Same  Procedure: Umbilical herniorrhaphy with mesh  Surgeon: Aviva Signs, MD  Anes: General endotracheal  Indications: Patient is a 41 year old black female who presents with a symptomatic umbilical hernia.  The risks and benefits of the procedure including bleeding, infection, mesh use, the possibility recurrence of the hernia were fully explained to the patient, who gave informed consent.  Procedure note: The patient was placed in the supine position.  After induction of general endotracheal anesthesia, the abdomen was prepped and draped using the usual sterile technique with ChloraPrep.  Surgical site confirmation was performed.  An infraumbilical incision was made down to the fascia.  The umbilicus was freed away from the underlying fascia.  The patient did have a 1-1/2 to 2 cm hernia defect with omentum and part of the falciform ligament within it.  The hernia sac was excised down to the fascial layer using a LigaSure..  Care was taken to avoid the bowel.  A 4.3 cm Bard Ventralax ST patch was then inserted and secured to the fascia using 0 Ethibond interrupted sutures.  The overlying fascia was reapproximated transversely using 0 Ethibond interrupted sutures.  The base the umbilicus was secured back to the fascia using a 2-0 Vicryl interrupted suture.  The subcutaneous layer was reapproximated using a 3-0 Vicryl interrupted suture.  Exparel was instilled into the surrounding wound.  The skin was closed using a 4-0 Monocryl subcuticular suture.  Dermabond was applied.  All tape and needle counts were correct at the end of the procedure.  The patient was extubated in the operating room and transferred to PACU in stable condition.  Complications: None  EBL: Minimal  Specimen: None

## 2022-01-13 NOTE — Anesthesia Procedure Notes (Signed)
Procedure Name: Intubation Date/Time: 01/13/2022 7:50 AM  Performed by: Lieutenant Diego, CRNAPre-anesthesia Checklist: Patient identified, Emergency Drugs available, Suction available and Patient being monitored Patient Re-evaluated:Patient Re-evaluated prior to induction Oxygen Delivery Method: Circle system utilized Preoxygenation: Pre-oxygenation with 100% oxygen Induction Type: IV induction Ventilation: Mask ventilation without difficulty Laryngoscope Size: Miller and 2 Grade View: Grade I Tube type: Oral Tube size: 7.0 mm Number of attempts: 1 Airway Equipment and Method: Stylet Placement Confirmation: ETT inserted through vocal cords under direct vision, positive ETCO2 and breath sounds checked- equal and bilateral Secured at: 22 cm Tube secured with: Tape Dental Injury: Teeth and Oropharynx as per pre-operative assessment

## 2022-01-13 NOTE — Transfer of Care (Signed)
Immediate Anesthesia Transfer of Care Note  Patient: [place] M Mur[place]i  Procedure(s) Performed: HERNIA REPAIR UMBILICAL ADULT W/ MESH (Abdomen)  Patient Location: PACU  Anesthesia Type:General  Level of Consciousness: awake and alert   Airway & Oxygen Therapy: Patient Spontanous Breathing and Patient connected to face mask oxygen  Post-op Assessment: Report given to RN and Post -op Vital signs reviewed and stable  Post vital signs: Reviewed and stable  Last Vitals:  Vitals Value Taken Time  BP    Temp 36.7 C 01/13/22 0834  Pulse    Resp    SpO2      Last Pain:  Vitals:   01/13/22 0646  TempSrc: Oral  PainSc: 3       Patients Stated Pain Goal: 8 (64/84/72 0721)  Complications: No notable events documented.

## 2022-01-13 NOTE — Anesthesia Preprocedure Evaluation (Signed)
Anesthesia Evaluation  Patient identified by MRN, date of birth, ID band Patient awake    Reviewed: Allergy & Precautions, H&P , NPO status , Patient's Chart, lab work & pertinent test results, reviewed documented beta blocker date and time   Airway Mallampati: II  TM Distance: >3 FB Neck ROM: full    Dental no notable dental hx.    Pulmonary neg pulmonary ROS, Current Smoker and Patient abstained from smoking.,    Pulmonary exam normal breath sounds clear to auscultation       Cardiovascular Exercise Tolerance: Good hypertension,  Rhythm:regular Rate:Normal     Neuro/Psych PSYCHIATRIC DISORDERS Depression negative neurological ROS     GI/Hepatic negative GI ROS, Neg liver ROS,   Endo/Other  negative endocrine ROS  Renal/GU negative Renal ROS  negative genitourinary   Musculoskeletal   Abdominal   Peds  Hematology negative hematology ROS (+)   Anesthesia Other Findings   Reproductive/Obstetrics negative OB ROS                             Anesthesia Physical Anesthesia Plan  ASA: 2  Anesthesia Plan: General and General LMA   Post-op Pain Management:    Induction:   PONV Risk Score and Plan: Ondansetron  Airway Management Planned:   Additional Equipment:   Intra-op Plan:   Post-operative Plan:   Informed Consent: I have reviewed the patients History and Physical, chart, labs and discussed the procedure including the risks, benefits and alternatives for the proposed anesthesia with the patient or authorized representative who has indicated his/her understanding and acceptance.     Dental Advisory Given  Plan Discussed with: CRNA  Anesthesia Plan Comments:         Anesthesia Quick Evaluation

## 2022-01-13 NOTE — Anesthesia Postprocedure Evaluation (Signed)
Anesthesia Post Note  Patient: Sherry [name]  Procedure(s) Performed: HERNIA REPAIR UMBILICAL ADULT W/ MESH (Abdomen)  Patient location during evaluation: Phase II Anesthesia Type: General Level of consciousness: awake Pain management: pain level controlled Vital Signs Assessment: post-procedure vital signs reviewed and stable Respiratory status: spontaneous breathing and respiratory function stable Cardiovascular status: blood pressure returned to baseline and stable Postop Assessment: no headache and no apparent nausea or vomiting Anesthetic complications: no Comments: Late entry   No notable events documented.   Last Vitals:  Vitals:   01/13/22 0915 01/13/22 0922  BP: 120/84 133/88  Pulse: 92 87  Resp: 15 15  Temp:  36.7 C  SpO2: 98% 98%    Last Pain:  Vitals:   01/13/22 0922  TempSrc: Oral  PainSc: [place]

## 2022-01-13 NOTE — Interval H&P Note (Signed)
History and Physical Interval Note:  01/13/2022 7:21 AM  Sherry [name]  has presented today for surgery, with the diagnosis of UMBILICAL HERNIA <3 CM.  The various methods of treatment have been discussed with the patient and family. After consideration of risks, benefits and other options for treatment, the patient has consented to  Procedure(s): HERNIA REPAIR UMBILICAL ADULT W/ MESH (N/A) as a surgical intervention.  The patient's history has been reviewed, patient examined, no change in status, stable for surgery.  I have reviewed the patient's chart and labs.  Questions were answered to the patient's satisfaction.     Aviva Signs

## 2022-01-16 ENCOUNTER — Telehealth: Payer: Self-pay | Admitting: *Deleted

## 2022-01-16 NOTE — Telephone Encounter (Signed)
Surgical Date: 34/28/7681 Procedure: Umbilical Hernia Repair W/ Mesh  Received call from patient [phone]~ telephone.   Patient reports that post op pain is not managed well at this time, but she cannot tolerate Hydrocodone as it makes her too drowsy.   Reports that she is currently taking APAP '1000mg'$  for pain. States that hospital advised her to not take IBU as it can increase bleeding.   Advised that she has no other risk factors for clotting disorder or increased bleeding. Advised that IBU is safe for post surgical pain.   Given instructions on provider pain management plan: Tylenol '1000mg'$  @ 6am, 12noon, 6pm, 12 midnight (Do not exceed '4000mg'$  of tylenol a day) Ibuprofen '800mg'$  @ 9am, 3pm, 9pm, 3am (Do not exceed '3600mg'$  of ibuprofen a day) Opioid Pain Relief to be used sparingly  Also states that she is having increased constipation. Advised to add colace, senna, or miralax to assist in moving bowels. Advised to increase water intake.   Advised if pain is not adequately managed to return call for further instructions.

## 2022-01-18 ENCOUNTER — Encounter (HOSPITAL_COMMUNITY): Payer: Self-pay | Admitting: General Surgery

## 2022-01-25 ENCOUNTER — Encounter: Payer: Self-pay | Admitting: General Surgery

## 2022-01-25 ENCOUNTER — Other Ambulatory Visit: Payer: Self-pay

## 2022-01-25 ENCOUNTER — Ambulatory Visit (INDEPENDENT_AMBULATORY_CARE_PROVIDER_SITE_OTHER): Payer: Medicaid Other | Admitting: General Surgery

## 2022-01-25 VITALS — BP 120/82 | HR 96 | Temp 98.7°F | Resp 16 | Ht 63.0 in | Wt 190.0 lb

## 2022-01-25 DIAGNOSIS — Z09 Encounter for follow-up examination after completed treatment for conditions other than malignant neoplasm: Secondary | ICD-10-CM

## 2022-01-25 NOTE — Progress Notes (Signed)
Subjective:     Sherry [name]   Here for postoperative visit, s/p umbilical herniorrhaphy with mesh.  Doing well.  Still having some incisional pain.  It is resolving. Objective:    BP 120/82   Pulse 96   Temp 98.7 F (37.1 C) (Oral)   Resp 16   Ht '5\' 3"'$  (1.6 m)   Wt 190 lb (86.2 kg)   LMP 01/14/2022 (Approximate)   SpO2 96%   BMI 33.66 kg/m   General:  alert, cooperative, and no distress  Abdomen soft, incision healing well.     Assessment:    Doing well postoperatively.    Plan:    Use ibuprofen for pain.  Reassured patient that pain will resolve with time.  Follow up here prn.

## 2022-03-30 ENCOUNTER — Telehealth: Payer: Self-pay | Admitting: Adult Health

## 2022-03-30 NOTE — Telephone Encounter (Signed)
Called pt in follow up of needed colpo, had HSIL  11/18/21 on pap, and never made appt. She did have umbilical repair last year and doing well. But is is complaining of heavy periods has nexplanon, will get colpo scheduled and address periods then. Appt 04/11/22 with Wells Guiles

## 2022-04-11 ENCOUNTER — Encounter: Payer: Medicaid Other | Admitting: Women's Health

## 2022-05-09 ENCOUNTER — Encounter: Payer: Self-pay | Admitting: Adult Health

## 2022-07-03 ENCOUNTER — Other Ambulatory Visit: Payer: Self-pay | Admitting: Physician Assistant

## 2022-07-03 DIAGNOSIS — H7101 Cholesteatoma of attic, right ear: Secondary | ICD-10-CM

## 2022-07-03 DIAGNOSIS — H6991 Unspecified Eustachian tube disorder, right ear: Secondary | ICD-10-CM

## 2022-07-27 ENCOUNTER — Ambulatory Visit
Admission: RE | Admit: 2022-07-27 | Discharge: 2022-07-27 | Disposition: A | Discharge status: Home / Self Care | Payer: Medicaid Other | Source: Ambulatory Visit | Attending: Physician Assistant | Admitting: Physician Assistant

## 2022-07-27 DIAGNOSIS — H7101 Cholesteatoma of attic, right ear: Secondary | ICD-10-CM

## 2022-07-27 DIAGNOSIS — H6991 Unspecified Eustachian tube disorder, right ear: Secondary | ICD-10-CM

## 2023-04-03 HISTORY — PX: OTHER SURGICAL HISTORY: SHX169

## 2023-06-19 ENCOUNTER — Ambulatory Visit: Admitting: Adult Health

## 2023-06-19 ENCOUNTER — Other Ambulatory Visit (HOSPITAL_COMMUNITY)
Admission: RE | Admit: 2023-06-19 | Discharge: 2023-06-19 | Disposition: A | Discharge status: Home / Self Care | Source: Ambulatory Visit | Attending: Adult Health | Admitting: Adult Health

## 2023-06-19 ENCOUNTER — Encounter: Payer: Self-pay | Admitting: Adult Health

## 2023-06-19 VITALS — BP 137/95 | HR 102 | Ht 63.0 in | Wt 206.5 lb

## 2023-06-19 DIAGNOSIS — Z975 Presence of (intrauterine) contraceptive device: Secondary | ICD-10-CM

## 2023-06-19 DIAGNOSIS — N926 Irregular menstruation, unspecified: Secondary | ICD-10-CM | POA: Diagnosis not present

## 2023-06-19 DIAGNOSIS — Z124 Encounter for screening for malignant neoplasm of cervix: Secondary | ICD-10-CM

## 2023-06-19 DIAGNOSIS — I10 Essential (primary) hypertension: Secondary | ICD-10-CM | POA: Diagnosis not present

## 2023-06-19 DIAGNOSIS — R87613 High grade squamous intraepithelial lesion on cytologic smear of cervix (HGSIL): Secondary | ICD-10-CM

## 2023-06-19 DIAGNOSIS — Z3202 Encounter for pregnancy test, result negative: Secondary | ICD-10-CM

## 2023-06-19 LAB — POCT URINE PREGNANCY: Preg Test, Ur: NEGATIVE

## 2023-06-19 MED ORDER — MEGESTROL ACETATE 40 MG PO TABS: ORAL_TABLET | ORAL | 0 refills | Status: DC

## 2023-06-19 NOTE — Progress Notes (Signed)
  Subjective:     Patient ID: Sherry [name], female   DOB: 1981-02-25, 43 y.o.   MRN: [id number]  HPI Sherry is a 43 year old black female, married, [id number] in complaining of irregular bleeding since 05/11/23. She has nexplanon and it has past time for removal, has appt 07/02/23 for that.   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 Will do pap today.  PCP is Dr Olena Leatherwood  Review of Systems +irregular bleeding since 05/11/23 Reviewed past medical,surgical, social and family history. Reviewed medications and allergies.     Objective:   Physical Exam BP (!) 137/95 (BP Location: Right Arm, Patient Position: Sitting, Cuff Size: Large)   Pulse (!) 102   Ht 5\' 3"  (1.6 m)   Wt 206 lb 8 oz (93.7 kg)   LMP 05/11/2023 (Approximate)   BMI 36.58 kg/m  UPT is negative Skin warm and dry.Pelvic: external genitalia is normal in appearance no lesions, vagina: +blood,urethra has no lesions or masses noted, cervix:smooth and bulbous, Pap with GC/CHL and  HR HPV genotyping performed,uterus: normal size, shape and contour, non tender, no masses felt, adnexa: no masses or tenderness noted. Bladder is non tender and no masses felt.   Upstream - 06/19/23 1112       Pregnancy Intention Screening   Does the patient want to become pregnant in the next year? No    Does the patient's partner want to become pregnant in the next year? No    Would the patient like to discuss contraceptive options today? No      Contraception Wrap Up   Current Method Hormonal Implant    End Method Female Condom;Hormonal Implant    Contraception Counseling Provided Yes               Examination chaperoned by Malachy Mood LPN  Assessment:     1. Pregnancy examination or test, negative result - POCT urine pregnancy  2. Routine Papanicolaou smear Pap sent - Cytology - PAP( [place])  3. Irregular bleeding (Primary) +irregular bleeding since 05/11/23 Will rx megace to stop Meds ordered this encounter   Medications   megestrol (MEGACE) 40 MG tablet    Sig: Take 3 x 5 days then 2 x 5 days then 1 daily til bleeding stops    Dispense:  45 tablet    Refill:  0    Supervising Provider:   Duane Lope H [[id number]]     4. Nexplanon in place Placed 03/15/20 after delivery, for removal 07/02/23 with Theora Gianotti NP  5. High grade squamous intraepithelial lesion (HGSIL) on cytologic smear of cervix Pap sent   6. Chronic hypertension Not taking any meds  Follow up with PCP this week     Plan:     Return as scheduled for 07/02/23 for nexplanon removal

## 2023-06-26 LAB — CYTOLOGY - PAP
Chlamydia: NEGATIVE
Comment: NEGATIVE
Comment: NEGATIVE
Comment: NEGATIVE
Comment: NEGATIVE
Comment: NORMAL
Diagnosis: UNDETERMINED — AB
HPV 16: NEGATIVE
HPV 18 / 45: NEGATIVE
High risk HPV: POSITIVE — AB
Neisseria Gonorrhea: NEGATIVE

## 2023-06-27 ENCOUNTER — Encounter: Payer: Self-pay | Admitting: Adult Health

## 2023-06-27 DIAGNOSIS — R87610 Atypical squamous cells of undetermined significance on cytologic smear of cervix (ASC-US): Secondary | ICD-10-CM | POA: Insufficient documentation

## 2023-06-27 DIAGNOSIS — R87619 Unspecified abnormal cytological findings in specimens from cervix uteri: Secondary | ICD-10-CM | POA: Insufficient documentation

## 2023-06-27 DIAGNOSIS — R87613 High grade squamous intraepithelial lesion on cytologic smear of cervix (HGSIL): Secondary | ICD-10-CM | POA: Insufficient documentation

## 2023-07-02 ENCOUNTER — Ambulatory Visit: Admitting: Obstetrics and Gynecology

## 2023-07-02 ENCOUNTER — Encounter: Payer: Self-pay | Admitting: Obstetrics and Gynecology

## 2023-07-02 VITALS — BP 139/95 | HR 94 | Ht 64.0 in | Wt 205.5 lb

## 2023-07-02 DIAGNOSIS — Z3046 Encounter for surveillance of implantable subdermal contraceptive: Secondary | ICD-10-CM | POA: Diagnosis not present

## 2023-07-02 DIAGNOSIS — Z3202 Encounter for pregnancy test, result negative: Secondary | ICD-10-CM

## 2023-07-02 LAB — POCT URINE PREGNANCY: Preg Test, Ur: NEGATIVE

## 2023-07-02 MED ORDER — SLYND 4 MG PO TABS: 1.0000 | ORAL_TABLET | Freq: Every day | ORAL | 3 refills | Status: DC

## 2023-07-02 NOTE — Progress Notes (Signed)
     GYNECOLOGY OFFICE PROCEDURE NOTE  Sherry [name] is a 43 y.o. [phone] here for Nexplanon removal.  Last pap smear was on 06/19/23 and was ASCUS, HPV pos No other gynecologic concerns.   Nexplanon Removal Patient identified, informed consent performed, consent signed.   Appropriate time out taken.   Nexplanon site identified.  Area prepped in usual sterile fashon. One ml of 1% lidocaine was used to anesthetize the area at the distal end of the implant. A small stab incision was made right beside the implant on the distal portion.  The Nexplanon rod was grasped using hemostats and removed without difficulty.  There was minimal blood loss. There were no complications.  3 ml of 1% lidocaine was injected around the incision for post-procedure analgesia.  Steri-strips were applied over the small incision.  A pressure bandage was applied to reduce any bruising.    The patient tolerated the procedure well and was given post procedure instructions.  Patient is planning to "use POPs for contraception.  Follow up colpo 07/12/23 Has not taken Bp meds yet   Center for [employer], [facility]

## 2023-07-10 ENCOUNTER — Encounter: Admitting: Women's Health

## 2023-07-12 ENCOUNTER — Ambulatory Visit: Admitting: Women's Health

## 2023-07-12 ENCOUNTER — Encounter: Payer: Self-pay | Admitting: Women's Health

## 2023-07-12 ENCOUNTER — Other Ambulatory Visit (HOSPITAL_COMMUNITY)
Admission: RE | Admit: 2023-07-12 | Discharge: 2023-07-12 | Disposition: A | Discharge status: Home / Self Care | Source: Ambulatory Visit | Attending: Women's Health | Admitting: Women's Health

## 2023-07-12 VITALS — BP 128/87 | HR 87 | Ht 64.0 in | Wt 204.2 lb

## 2023-07-12 DIAGNOSIS — Z3202 Encounter for pregnancy test, result negative: Secondary | ICD-10-CM

## 2023-07-12 DIAGNOSIS — R87610 Atypical squamous cells of undetermined significance on cytologic smear of cervix (ASC-US): Secondary | ICD-10-CM | POA: Diagnosis present

## 2023-07-12 LAB — POCT URINE PREGNANCY: Preg Test, Ur: NEGATIVE

## 2023-07-12 NOTE — Progress Notes (Signed)
   COLPOSCOPY PROCEDURE NOTE Patient name: Sherry [name] MRN [id number]  Date of birth: April 11, 1980 Subjective Findings:   Sherry [name] is a 43 y.o. [phone] African American female being seen today for a colposcopy. Indication: Abnormal pap on 06/19/23: ASCUS w/ HRHPV positive: other (not 16, 18/45)  Prior cytology:  Date Result Procedure  11/18/21 HSIL w/ HRHPV positive: other (not 16, 18/45) Never came for colpo  10/13/19 HSIL w/ HRHPV positive: other (not 16, 18/45) ?Nl colpo during pregnancy, never came for repeat pp  Patient's last menstrual period was 05/11/2023 (approximate). Contraception: oral progesterone-only contraceptive. Menopausal: no. Hysterectomy: no.   Considering pregnancy: No  Smoker: yes. Immunocompromised: no.   The risks and benefits were explained and informed consent was obtained, and written copy is in chart. Pertinent History Reviewed:   Reviewed past medical,surgical, social, obstetrical and family history.  Reviewed problem list, medications and allergies. Objective Findings & Procedure:   Vitals:   07/12/23 1506 07/12/23 1600  BP: (!) 130/90 128/87  Pulse: 91 87  Weight: 204 lb 3.2 oz (92.6 kg)   Height: 5\' 4"  (1.626 m)   Body mass index is 35.05 kg/m.  Results for orders placed or performed in visit on 07/12/23 (from the past 24 hours)  POCT urine pregnancy   Collection Time: 07/12/23  3:43 PM  Result Value Ref Range   Preg Test, Ur Negative Negative     Time out was performed.  Speculum placed in the vagina, cervix fully visualized. SCJ: not fully visualized. Cervix swabbed x 3 with acetic acid.  Acetowhitening present: Yes Cervix: no visible lesions, no mosaicism, no punctation, no abnormal vasculature, and acetowhite lesion(s) noted at 11-12 o'clock. Endocervical curettage performed, Cervical biopsies taken at 11 o'clock, and Hemostasis achieved with Monsel's solution. Vagina: vaginal colposcopy not performed Vulva: vulvar colposcopy not  performed  Specimens: 2  Complications: none  Chaperone: Laurinda Porch  Colposcopic Impression & Plan:   Colposcopy findings consistent with LSIL Plan: Post biopsy instructions given, Will notify patient of results when back, and Will base plan of care on pathology results and ASCCP guidelines  Return in about 1 year (around 07/11/2024) for Pap & physical.  Ferd Householder CNM, [facility] 07/12/2023 4:17 PM

## 2023-07-12 NOTE — Patient Instructions (Signed)
 Colposcopy, Care After  The following information offers guidance on how to care for yourself after your procedure. Your health care provider may also give you more specific instructions. If you have problems or questions, contact your health care provider. What can I expect after the procedure? If you had a colposcopy without a biopsy, you can expect to feel fine right away after your procedure. However, you may have some spotting of blood for a few days. You can return to your normal activities. If you had a colposcopy with a biopsy, it is common after the procedure to have: Soreness and mild pain. These may last for a few days. Mild vaginal bleeding or discharge that is dark-colored and grainy. This may last for a few days. The discharge may be caused by a liquid (solution) that was used during the procedure. You may need to wear a sanitary pad during this time. Spotting of blood for at least 48 hours after the procedure. Follow these instructions at home: Medicines Take over-the-counter and prescription medicines only as told by your health care provider. Talk with your health care provider about what type of over-the-counter pain medicines and prescription medicines you can start to take again. It is especially important to talk with your health care provider if you take blood thinners. Activity Avoid using douche products, using tampons, and having sex for at least 3 days after the procedure or for as long as told by your health care provider. Return to your normal activities as told by your health care provider. Ask your health care provider what activities are safe for you. General instructions Ask your health care provider if you may take baths, swim, or use a hot tub. You may take showers. If you use birth control (contraception), continue to use it. Keep all follow-up visits. This is important. Contact a health care provider if: You have a fever or chills. You faint or feel  light-headed. Get help right away if: You have heavy bleeding from your vagina or pass blood clots. Heavy bleeding is bleeding that soaks through a sanitary pad in less than 1 hour. You have vaginal discharge that is abnormal, is yellow in color, or smells bad. This could be a sign of infection. You have severe pain or cramps in your lower abdomen that do not go away with medicine. Summary If you had a colposcopy without a biopsy, you can expect to feel fine right away, but you may have some spotting of blood for a few days. You can return to your normal activities. If you had a colposcopy with a biopsy, it is common to have mild pain for a few days and spotting for 48 hours after the procedure. Avoid using douche products, using tampons, and having sex for at least 3 days after the procedure or for as long as told by your health care provider. Get help right away if you have heavy bleeding, severe pain, or signs of infection. This information is not intended to replace advice given to you by your health care provider. Make sure you discuss any questions you have with your health care provider. Document Revised: 08/01/2020 Document Reviewed: 08/01/2020 Elsevier Patient Education  2024 [employer].

## 2023-07-12 NOTE — Addendum Note (Signed)
 Addended by: Laurinda Porch A on: 07/12/2023 04:47 PM   Modules accepted: Orders

## 2023-07-16 ENCOUNTER — Encounter: Payer: Self-pay | Admitting: Women's Health

## 2023-07-16 LAB — SURGICAL PATHOLOGY

## 2023-07-26 ENCOUNTER — Encounter: Payer: Self-pay | Admitting: Obstetrics & Gynecology

## 2023-07-26 ENCOUNTER — Ambulatory Visit: Admitting: Obstetrics & Gynecology

## 2023-07-26 VITALS — BP 115/81 | HR 79 | Ht 64.0 in | Wt 206.0 lb

## 2023-07-26 DIAGNOSIS — R87613 High grade squamous intraepithelial lesion on cytologic smear of cervix (HGSIL): Secondary | ICD-10-CM | POA: Diagnosis not present

## 2023-07-26 NOTE — Progress Notes (Signed)
 Follow up appointment for results: Cervical biopsy  Chief Complaint  Patient presents with   Follow-up    Blood pressure 115/81, pulse 79, height 5\' 4"  (1.626 m), weight 206 lb (93.4 kg), last menstrual period 05/11/2023.  HSIL of the endocervix LSIL ectocervix Needs laser conization of the cervix  MEDS ordered this encounter: No orders of the defined types were placed in this encounter.   Orders for this encounter: No orders of the defined types were placed in this encounter.   Impression + Management Plan   ICD-10-CM   1. 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     All questions were answered.  Past Medical History:  Diagnosis Date   Hodgkin's lymphoma [facility])    HSV-2 infection    Miscarriage    Non Hodgkin's lymphoma (HCC) 2003   Vaginal Pap smear, abnormal    HGSIL +HRHPV    Past Surgical History:  Procedure Laterality Date   BIOPSY SALIVARY GLAND     cyst removed  04/03/2023   2 cysts removed from right eardrum   port acath     TONSILLECTOMY     TYMPANOSTOMY TUBE PLACEMENT     UMBILICAL HERNIA REPAIR N/A 01/13/2022   Procedure: HERNIA REPAIR UMBILICAL ADULT W/ MESH;  Surgeon: Alanda Allegra, MD;  Location: [facility];  Service: General;  Laterality: N/A;    OB History     Gravida  5   Para  3   Term  3   Preterm      AB  2   Living  3      SAB  2   IAB      Ectopic      Multiple  0   Live Births  3           Allergies  Allergen Reactions   Cefprozil Hives   Zithromax [Azithromycin] Nausea And Vomiting    Social History   Socioeconomic History   Marital status: Married    Spouse name: Not on file   Number of children: Not on file   Years of education: Not on file   Highest education level: Not on file  Occupational History    Occupation: Training and development officer    Employer: [id number]  Tobacco Use   Smoking status: Every Day    Current packs/day: 0.50    Average packs/day: 0.5 packs/day for 10.0 years (5.0 ttl pk-yrs)    Types: Cigarettes   Smokeless tobacco: Never  Vaping Use   Vaping status: Never Used  Substance and Sexual Activity   Alcohol use: Yes    Comment: occassional   Drug use: Not Currently    Types: Marijuana   Sexual activity: Not Currently    Birth control/protection: Condom, Pill  Other Topics Concern   Not on file  Social History Narrative   Not on file   Social Drivers of Health   Financial Resource Strain: Medium Risk (09/17/2019)   Overall Financial Resource Strain (CARDIA)    Difficulty of Paying Living Expenses: Somewhat hard  Food Insecurity: Low Risk  (07/21/2022)   [facility]   Hunger Vital Sign    Worried About Running Out of Food in the Last Year: Never true    Ran Out of Food in  the Last Year: Never true  Transportation Needs: Not on file (07/21/2022)  Physical Activity: Sufficiently Active (09/17/2019)   Exercise Vital Sign    Days of Exercise per Week: 5 days    Minutes of Exercise per Session: 70 min  Stress: Stress Concern Present (09/17/2019)   [employer] of Occupational Health - Occupational Stress Questionnaire    Feeling of Stress : To some extent  Social Connections: Moderately Isolated (09/17/2019)   Social Connection and Isolation Panel [NHANES]    Frequency of Communication with Friends and Family: Once a week    Frequency of Social Gatherings with Friends and Family: Once a week    Attends Religious Services: 1 to 4 times per year    Active Member of [employer] or Organizations: No    Attends Engineer, structural: Never    Marital Status: Married    Family History  Problem Relation Age of Onset   Diabetes Paternal Grandmother    Hypertension Paternal Grandmother    Arthritis Maternal Grandmother    Cancer Maternal  Grandmother        breast   Diabetes Maternal Grandmother    Hypertension Maternal Grandmother    Alcohol abuse Maternal Grandfather    Diabetes Maternal Aunt    Hypertension Maternal Aunt    Cancer Maternal Uncle        brain   Cancer Maternal Uncle        brain   Miscarriages / [place] Cousin

## 2023-07-27 ENCOUNTER — Encounter: Payer: Self-pay | Admitting: Obstetrics & Gynecology

## 2023-08-31 ENCOUNTER — Other Ambulatory Visit (HOSPITAL_COMMUNITY)

## 2023-08-31 NOTE — Patient Instructions (Addendum)
 Sherry [name]  08/31/2023     @PREFPERIOPPHARMACY @   Your procedure is scheduled on 09/04/2023.   Report to [facility] at  0900  A.M.   Call this number if you have problems the morning of surgery:  [phone]  If you experience any cold or flu symptoms such as cough, fever, chills, shortness of breath, etc. between now and your scheduled surgery, please notify us  at the above number.   Remember:  Do not eat after midnight.   You may drink clear liquids until  0700 am on 09/04/2023.    Clear liquids allowed are:                    Water, Juice (No red color; non-citric and without pulp; diabetics please choose diet or no sugar options), Carbonated beverages (diabetics please choose diet or no sugar options), Clear Tea (No creamer, milk, or cream, including half & half and powdered creamer), Black Coffee Only (No creamer, milk or cream, including half & half and powdered creamer), and Clear Sports drink (No red color; diabetics please choose diet or no sugar options)          At 0700 am on 09/04/2023 drink your carb drink. You can have nothing else to drink after this.    Take these medicines the morning of surgery with A SIP OF WATER                                                        None.    Do not wear jewelry, make-up or nail polish, including gel polish,  artificial nails, or any other type of covering on natural nails (fingers and  toes).  Do not wear lotions, powders, or perfumes, or deodorant.  Do not shave 48 hours prior to surgery.  Men may shave face and neck.  Do not bring valuables to the hospital.  [facility] is not responsible for any belongings or valuables.  Contacts, dentures or bridgework may not be worn into surgery.  Leave your suitcase in the car.  After surgery it may be brought to your room.  For patients admitted to the hospital, discharge time will be determined by your treatment team.  Patients discharged the day of surgery will not  be allowed to drive home and must have someone with them for 24 hours.    Special instructions:   DO NOT smoke tobacco or vape for 24 hours before your procedure.  Please read over the following fact sheets that you were given. Coughing and Deep Breathing, Surgical Site Infection Prevention, Anesthesia Post-op Instructions, and Care and Recovery After Surgery       Cervical Laser Surgery, Care After After cervical laser surgery, it is common to have: Pain or discomfort. Mild cramping. Bleeding, spotting, or brownish discharge from your vagina. Follow these instructions at home: Activity  Rest as told by your health care provider. Return to your normal activities as told by you provider. Ask your provider what activities are safe for you. Do not have sex until your provider says it is okay. General instructions Take over-the-counter and prescription medicines only as told by your provider. Ask your provider if the medicine prescribed to you requires you to avoid driving or using machinery. Wear menstrual  pads to absorb any bleeding, spotting, and discharge. Do not put anything into your vagina, including tampons or douche, until your provider says it is okay. It is up to you to get the results of your procedure. Ask your provider, or the department that is doing the procedure, when your results will be ready. Your provider may give you more instructions. Make sure you know what you can and cannot do. Contact a health care provider if: Your pain or cramping does not improve. Your periods are more painful than usual. You do not get your period as expected. Get help right away if: You have any symptoms of infection, such as: A fever. Chills. Discharge that smells bad. You have severe pain in your lower abdomen. You have heavy bleeding from your vagina. A sign of heavy bleeding is that you need to use more than one pad per hour. You have vaginal bleeding with clumps of blood (blood  clots). This information is not intended to replace advice given to you by your health care provider. Make sure you discuss any questions you have with your health care provider. Document Revised: 11/15/2021 Document Reviewed: 11/15/2021 Elsevier Patient Education  2024 [employer].General Anesthesia, Adult, Care After The following information offers guidance on how to care for yourself after your procedure. Your health care provider may also give you more specific instructions. If you have problems or questions, contact your health care provider. What can I expect after the procedure? After the procedure, it is common for people to: Have pain or discomfort at the IV site. Have nausea or vomiting. Have a sore throat or hoarseness. Have trouble concentrating. Feel cold or chills. Feel weak, sleepy, or tired (fatigue). Have soreness and body aches. These can affect parts of the body that were not involved in surgery. Follow these instructions at home: For the time period you were told by your health care provider:  Rest. Do not participate in activities where you could fall or become injured. Do not drive or use machinery. Do not drink alcohol. Do not take sleeping pills or medicines that cause drowsiness. Do not make important decisions or sign legal documents. Do not take care of children on your own. General instructions Drink enough fluid to keep your urine pale yellow. If you have sleep apnea, surgery and certain medicines can increase your risk for breathing problems. Follow instructions from your health care provider about wearing your sleep device: Anytime you are sleeping, including during daytime naps. While taking prescription pain medicines, sleeping medicines, or medicines that make you drowsy. Return to your normal activities as told by your health care provider. Ask your health care provider what activities are safe for you. Take over-the-counter and prescription  medicines only as told by your health care provider. Do not use any products that contain nicotine or tobacco. These products include cigarettes, chewing tobacco, and vaping devices, such as e-cigarettes. These can delay incision healing after surgery. If you need help quitting, ask your health care provider. Contact a health care provider if: You have nausea or vomiting that does not get better with medicine. You vomit every time you eat or drink. You have pain that does not get better with medicine. You cannot urinate or have bloody urine. You develop a skin rash. You have a fever. Get help right away if: You have trouble breathing. You have chest pain. You vomit blood. These symptoms may be an emergency. Get help right away. Call 911. Do not wait to see  if the symptoms will go away. Do not drive yourself to the hospital. Summary After the procedure, it is common to have a sore throat, hoarseness, nausea, vomiting, or to feel weak, sleepy, or fatigue. For the time period you were told by your health care provider, do not drive or use machinery. Get help right away if you have difficulty breathing, have chest pain, or vomit blood. These symptoms may be an emergency. This information is not intended to replace advice given to you by your health care provider. Make sure you discuss any questions you have with your health care provider. Document Revised: 06/03/2021 Document Reviewed: 06/03/2021 Elsevier Patient Education  2024 [employer].How to Use Chlorhexidine  at Home in the Shower Chlorhexidine  gluconate (CHG) is a germ-killing (antiseptic) wash that's used to clean the skin. It can get rid of the germs that normally live on the skin and can keep them away for about 24 hours. If you're having surgery, you may be told to shower with CHG at home the night before surgery. This can help lower your risk for infection. To use CHG wash in the shower, follow the steps below. Supplies  needed: CHG body wash. Clean washcloth. Clean towel. How to use CHG in the shower Follow these steps unless you're told to use CHG in a different way: Start the shower. Use your normal soap and shampoo to wash your face and hair. Turn off the shower or move out of the shower stream. Pour CHG onto a clean washcloth. Do not use any type of brush or rough sponge. Start at your neck, washing your body down to your toes. Make sure you: Wash the part of your body where the surgery will be done for at least 1 minute. Do not scrub. Do not use CHG on your head or face unless your health care provider tells you to. If it gets into your ears or eyes, rinse them well with water. Do not wash your genitals with CHG. Wash your back and under your arms. Make sure to wash skin folds. Let the CHG sit on your skin for 1-2 minutes or as long as told. Rinse your entire body in the shower, including all body creases and folds. Turn off the shower. Dry off with a clean towel. Do not put anything on your skin afterward, such as powder, lotion, or perfume. Put on clean clothes or pajamas. If it's the night before surgery, sleep in clean sheets. General tips Use CHG only as told, and follow the instructions on the label. Use the full amount of CHG as told. This is often one bottle. Do not smoke and stay away from flames after using CHG. Your skin may feel sticky after using CHG. This is normal. The sticky feeling will go away as the CHG dries. Do not use CHG: If you have a chlorhexidine  allergy or have reacted to chlorhexidine  in the past. On open wounds or areas of skin that have broken skin, cuts, or scrapes. On babies younger than 34 months of age. Contact a health care provider if: You have questions about using CHG. Your skin gets irritated or itchy. You have a rash after using CHG. You swallow any CHG. Call your local poison control center [phone] in the [place] Your eyes itch badly, or they  become very red or swollen. Your hearing changes. You have trouble seeing. If you can't reach your provider, go to an urgent care or emergency room. Do not drive yourself. Get help right away  if: You have swelling or tingling in your mouth or throat. You make high-pitched whistling sounds when you breathe, most often when you breathe out (wheeze). You have trouble breathing. These symptoms may be an emergency. Call 911 right away. Do not wait to see if the symptoms will go away. Do not drive yourself to the hospital. This information is not intended to replace advice given to you by your health care provider. Make sure you discuss any questions you have with your health care provider. Document Revised: 09/19/2022 Document Reviewed: 09/15/2021 Elsevier Patient Education  2024 [employer].

## 2023-09-02 ENCOUNTER — Other Ambulatory Visit: Payer: Self-pay | Admitting: Obstetrics & Gynecology

## 2023-09-02 DIAGNOSIS — Z01818 Encounter for other preprocedural examination: Secondary | ICD-10-CM

## 2023-09-03 ENCOUNTER — Encounter (HOSPITAL_COMMUNITY)
Admission: RE | Admit: 2023-09-03 | Discharge: 2023-09-03 | Disposition: A | Discharge status: Home / Self Care | Source: Ambulatory Visit | Attending: Obstetrics & Gynecology | Admitting: Obstetrics & Gynecology

## 2023-09-03 ENCOUNTER — Encounter (HOSPITAL_COMMUNITY): Payer: Self-pay

## 2023-09-03 VITALS — BP 120/83 | HR 99 | Resp 18 | Ht 64.0 in | Wt 205.9 lb

## 2023-09-03 DIAGNOSIS — Z01818 Encounter for other preprocedural examination: Secondary | ICD-10-CM | POA: Diagnosis present

## 2023-09-03 DIAGNOSIS — R9431 Abnormal electrocardiogram [ECG] [EKG]: Secondary | ICD-10-CM | POA: Diagnosis not present

## 2023-09-03 DIAGNOSIS — I10 Essential (primary) hypertension: Secondary | ICD-10-CM | POA: Insufficient documentation

## 2023-09-03 HISTORY — DX: Essential (primary) hypertension: I10

## 2023-09-03 LAB — COMPREHENSIVE METABOLIC PANEL WITH GFR
ALT: 15 U/L (ref 0–44)
AST: 18 U/L (ref 15–41)
Albumin: 3.6 g/dL (ref 3.5–5.0)
Alkaline Phosphatase: 59 U/L (ref 38–126)
Anion gap: 12 (ref 5–15)
BUN: 11 mg/dL (ref 6–20)
CO2: 20 mmol/L — ABNORMAL LOW (ref 22–32)
Calcium: 9.5 mg/dL (ref 8.9–10.3)
Chloride: 107 mmol/L (ref 98–111)
Creatinine, Ser: 0.76 mg/dL (ref 0.44–1.00)
GFR, Estimated: >60 mL/min (ref >60)
Glucose, Bld: 82 mg/dL (ref 70–99)
Potassium: 4.8 mmol/L (ref 3.5–5.1)
Sodium: 139 mmol/L (ref 135–145)
Total Bilirubin: 0.5 mg/dL (ref 0.0–1.2)
Total Protein: 7.6 g/dL (ref 6.5–8.1)

## 2023-09-03 LAB — URINALYSIS, ROUTINE W REFLEX MICROSCOPIC
Bilirubin Urine: NEGATIVE
Glucose, UA: NEGATIVE mg/dL
Ketones, ur: NEGATIVE mg/dL
Leukocytes,Ua: NEGATIVE
Nitrite: NEGATIVE
Protein, ur: NEGATIVE mg/dL
Specific Gravity, Urine: 1.023 (ref 1.005–1.030)
pH: 5 (ref 5.0–8.0)

## 2023-09-03 LAB — CBC
HCT: 42.2 % (ref 36.0–46.0)
Hemoglobin: 13.9 g/dL (ref 12.0–15.0)
MCH: 30.1 pg (ref 26.0–34.0)
MCHC: 32.9 g/dL (ref 30.0–36.0)
MCV: 91.3 fL (ref 80.0–100.0)
Platelets: 205 10*3/uL (ref 150–400)
RBC: 4.62 MIL/uL (ref 3.87–5.11)
RDW: 14.4 % (ref 11.5–15.5)
WBC: 6.9 10*3/uL (ref 4.0–10.5)
nRBC: 0 % (ref 0.0–0.2)

## 2023-09-03 LAB — RAPID HIV SCREEN (HIV 1/2 AB+AG)
HIV 1/2 Antibodies: NONREACTIVE
HIV-1 P24 Antigen - HIV24: REACTIVE — AB

## 2023-09-03 LAB — PREGNANCY, URINE: Preg Test, Ur: NEGATIVE

## 2023-09-03 MED ORDER — GENTAMICIN SULFATE 40 MG/ML IJ SOLN
5.0000 mg/kg | INTRAVENOUS | Status: AC
  Administered 2023-09-04: 470 mg via INTRAVENOUS
  Filled 2023-09-03: qty 11.75

## 2023-09-03 MED ORDER — CLINDAMYCIN PHOSPHATE 900 MG/50ML IV SOLN
900.0000 mg | INTRAVENOUS | Status: AC
  Administered 2023-09-04: 900 mg via INTRAVENOUS

## 2023-09-04 ENCOUNTER — Ambulatory Visit (HOSPITAL_COMMUNITY): Admitting: Anesthesiology

## 2023-09-04 ENCOUNTER — Ambulatory Visit (HOSPITAL_COMMUNITY)
Admission: RE | Admit: 2023-09-04 | Discharge: 2023-09-04 | Disposition: A | Discharge status: Home / Self Care | Attending: Obstetrics & Gynecology | Admitting: Obstetrics & Gynecology

## 2023-09-04 ENCOUNTER — Other Ambulatory Visit: Payer: Self-pay

## 2023-09-04 ENCOUNTER — Encounter (HOSPITAL_COMMUNITY)
Admission: RE | Disposition: A | Discharge status: Home / Self Care | Payer: Self-pay | Source: Home / Self Care | Attending: Obstetrics & Gynecology

## 2023-09-04 ENCOUNTER — Encounter (HOSPITAL_COMMUNITY): Payer: Self-pay | Admitting: Obstetrics & Gynecology

## 2023-09-04 DIAGNOSIS — D060 Carcinoma in situ of endocervix: Secondary | ICD-10-CM | POA: Diagnosis not present

## 2023-09-04 DIAGNOSIS — F17210 Nicotine dependence, cigarettes, uncomplicated: Secondary | ICD-10-CM | POA: Diagnosis not present

## 2023-09-04 DIAGNOSIS — R87613 High grade squamous intraepithelial lesion on cytologic smear of cervix (HGSIL): Secondary | ICD-10-CM

## 2023-09-04 DIAGNOSIS — I10 Essential (primary) hypertension: Secondary | ICD-10-CM | POA: Diagnosis not present

## 2023-09-04 DIAGNOSIS — Z8572 Personal history of non-Hodgkin lymphomas: Secondary | ICD-10-CM | POA: Insufficient documentation

## 2023-09-04 DIAGNOSIS — Z8571 Personal history of Hodgkin lymphoma: Secondary | ICD-10-CM | POA: Diagnosis not present

## 2023-09-04 DIAGNOSIS — Z224 Carrier of infections with a predominantly sexual mode of transmission: Secondary | ICD-10-CM | POA: Diagnosis not present

## 2023-09-04 HISTORY — PX: CERVICAL ABLATION: SHX5771

## 2023-09-04 SURGERY — ABLATION, CERVIX
Anesthesia: General | Site: Vagina

## 2023-09-04 MED ORDER — FENTANYL CITRATE (PF) 100 MCG/2ML IJ SOLN
INTRAMUSCULAR | Status: DC | PRN
  Administered 2023-09-04: 100 ug via INTRAVENOUS

## 2023-09-04 MED ORDER — MIDAZOLAM HCL 2 MG/2ML IJ SOLN
INTRAMUSCULAR | Status: DC | PRN
Start: 2023-09-04 — End: 2023-09-04
  Administered 2023-09-04: 2 mg via INTRAVENOUS

## 2023-09-04 MED ORDER — PROPOFOL 10 MG/ML IV BOLUS
INTRAVENOUS | Status: DC | PRN
  Administered 2023-09-04: 200 mg via INTRAVENOUS

## 2023-09-04 MED ORDER — ONDANSETRON HCL 4 MG/2ML IJ SOLN
INTRAMUSCULAR | Status: AC
  Filled 2023-09-04: qty 2

## 2023-09-04 MED ORDER — MONSELS FERRIC SUBSULFATE EX SOLN
CUTANEOUS | Status: DC | PRN
  Administered 2023-09-04: 1 via TOPICAL

## 2023-09-04 MED ORDER — PHENYLEPHRINE 80 MCG/ML (10ML) SYRINGE FOR IV PUSH (FOR BLOOD PRESSURE SUPPORT)
PREFILLED_SYRINGE | INTRAVENOUS | Status: AC
  Filled 2023-09-04: qty 10

## 2023-09-04 MED ORDER — ACETIC ACID 5 % SOLN
Status: DC | PRN
  Administered 2023-09-04: 1 via TOPICAL

## 2023-09-04 MED ORDER — CHLORHEXIDINE GLUCONATE 0.12 % MT SOLN
15.0000 mL | Freq: Once | OROMUCOSAL | Status: AC
  Administered 2023-09-04: 15 mL via OROMUCOSAL

## 2023-09-04 MED ORDER — HYDROCODONE-ACETAMINOPHEN 5-325 MG PO TABS: 1.0000 | ORAL_TABLET | Freq: Four times a day (QID) | ORAL | 0 refills | Status: AC | PRN

## 2023-09-04 MED ORDER — ROCURONIUM BROMIDE 10 MG/ML (PF) SYRINGE
PREFILLED_SYRINGE | INTRAVENOUS | Status: AC
Start: 2023-09-04 — End: 2023-09-04
  Filled 2023-09-04: qty 10

## 2023-09-04 MED ORDER — LACTATED RINGERS IV SOLN: INTRAVENOUS | Status: DC

## 2023-09-04 MED ORDER — ORAL CARE MOUTH RINSE: 15.0000 mL | Freq: Once | OROMUCOSAL | Status: AC

## 2023-09-04 MED ORDER — SUCCINYLCHOLINE CHLORIDE 200 MG/10ML IV SOSY
PREFILLED_SYRINGE | INTRAVENOUS | Status: AC
  Filled 2023-09-04: qty 10

## 2023-09-04 MED ORDER — SUCCINYLCHOLINE CHLORIDE 200 MG/10ML IV SOSY
PREFILLED_SYRINGE | INTRAVENOUS | Status: DC | PRN
  Administered 2023-09-04: 160 mg via INTRAVENOUS

## 2023-09-04 MED ORDER — LIDOCAINE 2% (20 MG/ML) 5 ML SYRINGE
INTRAMUSCULAR | Status: DC | PRN
  Administered 2023-09-04: 1 mg via INTRAVENOUS

## 2023-09-04 MED ORDER — ROCURONIUM BROMIDE 10 MG/ML (PF) SYRINGE
PREFILLED_SYRINGE | INTRAVENOUS | Status: DC | PRN
  Administered 2023-09-04: 20 mg via INTRAVENOUS

## 2023-09-04 MED ORDER — KETOROLAC TROMETHAMINE 10 MG PO TABS: 10.0000 mg | ORAL_TABLET | Freq: Three times a day (TID) | ORAL | 0 refills | Status: AC | PRN

## 2023-09-04 MED ORDER — MIDAZOLAM HCL 2 MG/2ML IJ SOLN
INTRAMUSCULAR | Status: AC
Start: 2023-09-04 — End: 2023-09-04
  Filled 2023-09-04: qty 2

## 2023-09-04 MED ORDER — KETOROLAC TROMETHAMINE 30 MG/ML IJ SOLN: 30.0000 mg | INTRAMUSCULAR | Status: DC

## 2023-09-04 MED ORDER — SUGAMMADEX SODIUM 200 MG/2ML IV SOLN
INTRAVENOUS | Status: DC | PRN
  Administered 2023-09-04: 400 mg via INTRAVENOUS

## 2023-09-04 MED ORDER — OXYCODONE HCL 5 MG/5ML PO SOLN: 5.0000 mg | Freq: Once | ORAL | Status: DC | PRN

## 2023-09-04 MED ORDER — LIDOCAINE 2% (20 MG/ML) 5 ML SYRINGE
INTRAMUSCULAR | Status: AC
  Filled 2023-09-04: qty 5

## 2023-09-04 MED ORDER — OXYCODONE HCL 5 MG PO TABS: 5.0000 mg | ORAL_TABLET | Freq: Once | ORAL | Status: DC | PRN

## 2023-09-04 MED ORDER — LIDOCAINE HCL (PF) 1 % IJ SOLN
INTRAMUSCULAR | Status: AC
  Filled 2023-09-04: qty 2

## 2023-09-04 MED ORDER — ONDANSETRON HCL 4 MG/2ML IJ SOLN
INTRAMUSCULAR | Status: DC | PRN
  Administered 2023-09-04: 4 mg via INTRAVENOUS

## 2023-09-04 MED ORDER — POVIDONE-IODINE 10 % EX SWAB: 2.0000 | Freq: Once | CUTANEOUS | Status: DC

## 2023-09-04 MED ORDER — ONDANSETRON 8 MG PO TBDP: 8.0000 mg | ORAL_TABLET | Freq: Three times a day (TID) | ORAL | 0 refills | Status: AC | PRN

## 2023-09-04 MED ORDER — KETOROLAC TROMETHAMINE 30 MG/ML IJ SOLN
INTRAMUSCULAR | Status: AC
  Filled 2023-09-04: qty 1

## 2023-09-04 MED ORDER — PHENYLEPHRINE 80 MCG/ML (10ML) SYRINGE FOR IV PUSH (FOR BLOOD PRESSURE SUPPORT)
PREFILLED_SYRINGE | INTRAVENOUS | Status: DC | PRN
  Administered 2023-09-04: 100 ug via INTRAVENOUS
  Administered 2023-09-04: 80 ug via INTRAVENOUS
  Administered 2023-09-04: 100 ug via INTRAVENOUS
  Administered 2023-09-04: 80 ug via INTRAVENOUS

## 2023-09-04 MED ORDER — FENTANYL CITRATE PF 50 MCG/ML IJ SOSY: 25.0000 ug | PREFILLED_SYRINGE | INTRAMUSCULAR | Status: DC | PRN

## 2023-09-04 MED ORDER — ONDANSETRON HCL 4 MG/2ML IJ SOLN: 4.0000 mg | Freq: Once | INTRAMUSCULAR | Status: DC | PRN

## 2023-09-04 MED ORDER — WATER FOR IRRIGATION, STERILE IR SOLN
Status: DC | PRN
  Administered 2023-09-04: 1000 mL

## 2023-09-04 MED ORDER — CLINDAMYCIN PHOSPHATE 900 MG/50ML IV SOLN
INTRAVENOUS | Status: AC
  Filled 2023-09-04: qty 50

## 2023-09-04 MED ORDER — MONSELS FERRIC SUBSULFATE EX SOLN
CUTANEOUS | Status: AC
  Filled 2023-09-04: qty 8

## 2023-09-04 MED ORDER — FENTANYL CITRATE (PF) 100 MCG/2ML IJ SOLN
INTRAMUSCULAR | Status: AC
Start: 2023-09-04 — End: 2023-09-04
  Filled 2023-09-04: qty 2

## 2023-09-04 SURGICAL SUPPLY — 22 items
BAG HAMPER (MISCELLANEOUS) ×1 IMPLANT
CLOTH BEACON ORANGE TIMEOUT ST (SAFETY) ×1 IMPLANT
COVER LIGHT HANDLE (MISCELLANEOUS) IMPLANT
COVER MAYO STAND XLG (MISCELLANEOUS) ×1 IMPLANT
GAUZE 4X4 16PLY ~~LOC~~+RFID DBL (SPONGE) ×1 IMPLANT
GLOVE BIOGEL PI IND STRL 7.0 (GLOVE) ×2 IMPLANT
GLOVE BIOGEL PI IND STRL 8 (GLOVE) ×1 IMPLANT
GLOVE ECLIPSE 8.0 STRL XLNG CF (GLOVE) ×1 IMPLANT
GOWN STRL REUS W/TWL LRG LVL3 (GOWN DISPOSABLE) ×1 IMPLANT
GOWN STRL REUS W/TWL XL LVL3 (GOWN DISPOSABLE) ×1 IMPLANT
KIT TURNOVER KIT A (KITS) ×1 IMPLANT
LASER FIBER 1000M SMARTSCOPE (Laser) ×1 IMPLANT
MANIFOLD NEPTUNE II (INSTRUMENTS) ×1 IMPLANT
MARKER SKIN DUAL TIP RULER LAB (MISCELLANEOUS) ×1 IMPLANT
PACK SRG BSC III STRL LF ECLPS (CUSTOM PROCEDURE TRAY) ×1 IMPLANT
PAD ARMBOARD POSITIONER FOAM (MISCELLANEOUS) ×1 IMPLANT
POSITIONER HEAD 8X9X4 ADT (SOFTGOODS) ×1 IMPLANT
SET BASIN LINEN APH (SET/KITS/TRAYS/PACK) ×1 IMPLANT
SHEET LAVH (DRAPES) ×1 IMPLANT
SWAB PROCTOSCOPIC (MISCELLANEOUS) ×1 IMPLANT
TUBING SMOKE EVAC CO2 (TUBING) ×1 IMPLANT
WATER STERILE IRR 1000ML POUR (IV SOLUTION) ×1 IMPLANT

## 2023-09-04 NOTE — Anesthesia Procedure Notes (Signed)
 Procedure Name: Intubation Date/Time: 09/04/2023 10:56 AM  Performed by: Nola Battiest, CRNAPre-anesthesia Checklist: Patient identified, Emergency Drugs available, Suction available and Patient being monitored Patient Re-evaluated:Patient Re-evaluated prior to induction Oxygen Delivery Method: Circle System Utilized Preoxygenation: Pre-oxygenation with 100% oxygen Induction Type: IV induction Laryngoscope Size: Glidescope and 3 Grade View: Grade I Tube type: Oral Tube size: 7.0 mm Number of attempts: 1 Airway Equipment and Method: Stylet and Oral airway Placement Confirmation: ETT inserted through vocal cords under direct vision, positive ETCO2 and breath sounds checked- equal and bilateral Tube secured with: Tape Dental Injury: Teeth and Oropharynx as per pre-operative assessment

## 2023-09-04 NOTE — Transfer of Care (Signed)
 Immediate Anesthesia Transfer of Care Note  Patient: Sherry [name]  Procedure(s) Performed: ABLATION, CERVIX (Vagina )  Patient Location: PACU  Anesthesia Type:General  Level of Consciousness: oriented, drowsy, and patient cooperative  Airway & Oxygen Therapy: Patient Spontanous Breathing and Patient connected to face mask oxygen  Post-op Assessment: Report given to RN and Post -op Vital signs reviewed and stable  Post vital signs: Reviewed and stable  Last Vitals:  Vitals Value Taken Time  BP 118/81 09/04/23 11:42  Temp    Pulse 110 09/04/23 11:42  Resp 11 09/04/23 11:42  SpO2 99 % 09/04/23 11:42  Vitals shown include unfiled device data.  Last Pain:  Vitals:   09/04/23 1003  TempSrc:   PainSc: 4       Patients Stated Pain Goal: 7 (09/04/23 1003)  Complications: No notable events documented.

## 2023-09-04 NOTE — Op Note (Signed)
 Preoperative diagnosis:  1. 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  History of lymphoma 2003                                           Postoperative Diagnosis:  Same as above  Procedure:  Cervical conization using laser,  ablation of cervical bed using laser  Surgeon:  Laron Plummer MD  Anesthesia:  Laryngeal mask airway  Findings:  distinct lesion noted in the endocervix today I do not think I got above it with the conization specimen, it went up quite a ways but I did ablate the cone bed and got past it with ablation   Description of operation:  Patient was taken to the operating room and placed in the supine position where she underwent laryngeal mask airway anesthesia.  She was then placed in the high lithotomy position using candy cane stirrups.  She was then draped out for a laser procedure.  The microscope was used and 3% acetic acid was placed on the cervix.  The holmium laser was then employed at a power of 2.5 and rates between 8 and 15.  I achieved a couple millimeter margin around lesions both at 12:00 and 6:00 the laser was used to perform a conization.  The specimen was removed and sent to pathology for evaluation.  As is always the case with laser I did achieve at an appropriate margin around the disease with shrinkage of the tissue during the procedure it may appear to be a positive lateral margin.  However the surgical margin is indeed clear.  I then used the laser to ablate the conization bed to a depth of 5-7 mm laterally coning down to 9 mm centrally and began getting good surgical margin.  Additional hemostasis was achieved using Monsel solution.  In the conization bed was completely hemostatic.  Blood loss for the procedure was none.  The patient received gent/clinda and toradol  preoperatively.  The patient was awakened from anesthesia and taken to the recovery room in good stable condition with all counts being  correct.  She will be followed up in the office in one month for evaluation of the conization bed.  Wendelyn Halter, MD 09/04/2023 11:48 AM

## 2023-09-04 NOTE — Anesthesia Preprocedure Evaluation (Signed)
 Anesthesia Evaluation  Patient identified by MRN, date of birth, ID band Patient awake    Reviewed: Allergy & Precautions, H&P , NPO status , Patient's Chart, lab work & pertinent test results, reviewed documented beta blocker date and time   Airway Mallampati: II  TM Distance: >3 FB Neck ROM: full    Dental no notable dental hx.    Pulmonary neg pulmonary ROS, Current Smoker and Patient abstained from smoking.   Pulmonary exam normal breath sounds clear to auscultation       Cardiovascular Exercise Tolerance: Good hypertension,  Rhythm:regular Rate:Normal     Neuro/Psych  PSYCHIATRIC DISORDERS  Depression    negative neurological ROS     GI/Hepatic negative GI ROS, Neg liver ROS,,,  Endo/Other  negative endocrine ROS    Renal/GU negative Renal ROS  negative genitourinary   Musculoskeletal   Abdominal   Peds  Hematology negative hematology ROS (+)   Anesthesia Other Findings   Reproductive/Obstetrics negative OB ROS                             Anesthesia Physical Anesthesia Plan  ASA: 2  Anesthesia Plan: General and General LMA   Post-op Pain Management:    Induction:   PONV Risk Score and Plan: Ondansetron   Airway Management Planned:   Additional Equipment:   Intra-op Plan:   Post-operative Plan:   Informed Consent: I have reviewed the patients History and Physical, chart, labs and discussed the procedure including the risks, benefits and alternatives for the proposed anesthesia with the patient or authorized representative who has indicated his/her understanding and acceptance.     Dental Advisory Given  Plan Discussed with: CRNA  Anesthesia Plan Comments:        Anesthesia Quick Evaluation

## 2023-09-04 NOTE — H&P (Signed)
 Preoperative History and Physical  Sherry [name] is a 43 y.o. [phone] with Patient's last menstrual period was 08/03/2023. admitted for a laser conization of the cervix due to HSIL of the endocervix.  Hx of Hodgkin's lymphoma  PMH:    Past Medical History:  Diagnosis Date   Hodgkin's lymphoma (HCC)    HSV-2 infection    Hypertension    Miscarriage    Non Hodgkin's lymphoma (HCC) 2003   Vaginal Pap smear, abnormal    HGSIL +HRHPV    PSH:     Past Surgical History:  Procedure Laterality Date   BIOPSY SALIVARY GLAND     cyst removed  04/03/2023   2 cysts removed from right eardrum   port acath     TONSILLECTOMY     TYMPANOSTOMY TUBE PLACEMENT     UMBILICAL HERNIA REPAIR N/A 01/13/2022   Procedure: HERNIA REPAIR UMBILICAL ADULT W/ MESH;  Surgeon: Alanda Allegra, MD;  Location: [facility];  Service: General;  Laterality: N/A;    POb/GynH:      OB History     Gravida  5   Para  3   Term  3   Preterm      AB  2   Living  3      SAB  2   IAB      Ectopic      Multiple  0   Live Births  3           SH:   Social History   Tobacco Use   Smoking status: Every Day    Current packs/day: 0.50    Average packs/day: 0.5 packs/day for 10.0 years (5.0 ttl pk-yrs)    Types: Cigarettes   Smokeless tobacco: Never  Vaping Use   Vaping status: Never Used  Substance Use Topics   Alcohol use: Yes    Comment: occassional   Drug use: Not Currently    Types: Marijuana    FH:    Family History  Problem Relation Age of Onset   Diabetes Paternal Grandmother    Hypertension Paternal Grandmother    Arthritis Maternal Grandmother    Cancer Maternal Grandmother        breast   Diabetes Maternal Grandmother    Hypertension Maternal Grandmother    Alcohol abuse Maternal Grandfather    Diabetes Maternal Aunt    Hypertension Maternal Aunt    Cancer Maternal Uncle        brain   Cancer Maternal Uncle        brain   Miscarriages / Stillbirths Cousin       Allergies:  Allergies  Allergen Reactions   Cefprozil Hives   Zithromax [Azithromycin] Nausea And Vomiting    Medications:       Current Facility-Administered Medications:    chlorhexidine  (PERIDEX ) 0.12 % solution 15 mL, 15 mL, Mouth/Throat, Once **OR** Oral care mouth rinse, 15 mL, Mouth Rinse, Once, Kiel, Allan Arab, MD   clindamycin (CLEOCIN) 900 MG/50ML IVPB, , , ,    clindamycin (CLEOCIN) IVPB 900 mg, 900 mg, Intravenous, 60 min Pre-Op **AND** gentamicin (GARAMYCIN) 470 mg in dextrose  5 % 100 mL IVPB, 5 mg/kg, Intravenous, 60 min Pre-Op, Kinjal Neitzke, Sixto Duhamel, MD   ketorolac  (TORADOL ) 30 MG/ML injection 30 mg, 30 mg, Intravenous, On Call to OR, Wendelyn Halter, MD   lactated ringers  infusion, , Intravenous, Continuous, Kiel, Allan Arab, MD   povidone-iodine 10 % swab 2 Application, 2 Application, Topical, Once, Shameek Nyquist  H, MD  Review of Systems:   Review of Systems  Constitutional: Negative for fever, chills, weight loss, malaise/fatigue and diaphoresis.  HENT: Negative for hearing loss, ear pain, nosebleeds, congestion, sore throat, neck pain, tinnitus and ear discharge.   Eyes: Negative for blurred vision, double vision, photophobia, pain, discharge and redness.  Respiratory: Negative for cough, hemoptysis, sputum production, shortness of breath, wheezing and stridor.   Cardiovascular: Negative for chest pain, palpitations, orthopnea, claudication, leg swelling and PND.  Gastrointestinal: Positive for abdominal pain. Negative for heartburn, nausea, vomiting, diarrhea, constipation, blood in stool and melena.  Genitourinary: Negative for dysuria, urgency, frequency, hematuria and flank pain.  Musculoskeletal: Negative for myalgias, back pain, joint pain and falls.  Skin: Negative for itching and rash.  Neurological: Negative for dizziness, tingling, tremors, sensory change, speech change, focal weakness, seizures, loss of consciousness, weakness and headaches.   Endo/Heme/Allergies: Negative for environmental allergies and polydipsia. Does not bruise/bleed easily.  Psychiatric/Behavioral: Negative for depression, suicidal ideas, hallucinations, memory loss and substance abuse. The patient is not nervous/anxious and does not have insomnia.      PHYSICAL EXAM:  Pulse 96, temperature 98.5 F (36.9 C), temperature source Oral, last menstrual period 08/03/2023.    Vitals reviewed. Constitutional: She is oriented to person, place, and time. She appears well-developed and well-nourished.  HENT:  Head: Normocephalic and atraumatic.  Right Ear: External ear normal.  Left Ear: External ear normal.  Nose: Nose normal.  Mouth/Throat: Oropharynx is clear and moist.  Eyes: Conjunctivae and EOM are normal. Pupils are equal, round, and reactive to light. Right eye exhibits no discharge. Left eye exhibits no discharge. No scleral icterus.  Neck: Normal range of motion. Neck supple. No tracheal deviation present. No thyromegaly present.  Cardiovascular: Normal rate, regular rhythm, normal heart sounds and intact distal pulses.  Exam reveals no gallop and no friction rub.   No murmur heard. Respiratory: Effort normal and breath sounds normal. No respiratory distress. She has no wheezes. She has no rales. She exhibits no tenderness.  GI: Soft. Bowel sounds are normal. She exhibits no distension and no mass. There is tenderness. There is no rebound and no guarding.  Genitourinary:       Vulva is normal without lesions Vagina is pink moist without discharge Cervix normal in appearance and pap is normal Uterus is normal size, contour, position, consistency, mobility, non-tender Adnexa is negative with normal sized ovaries by sonogram  Musculoskeletal: Normal range of motion. She exhibits no edema and no tenderness.  Neurological: She is alert and oriented to person, place, and time. She has normal reflexes. She displays normal reflexes. No cranial nerve deficit.  She exhibits normal muscle tone. Coordination normal.  Skin: Skin is warm and dry. No rash noted. No erythema. No pallor.  Psychiatric: She has a normal mood and affect. Her behavior is normal. Judgment and thought content normal.    Labs: Results for orders placed or performed during the hospital encounter of 09/03/23 (from the past 2 weeks)  Comprehensive metabolic panel   Collection Time: 09/03/23  9:34 AM  Result Value Ref Range   Sodium 139 135 - 145 mmol/L   Potassium 4.8 3.5 - 5.1 mmol/L   Chloride 107 98 - 111 mmol/L   CO2 20 (L) 22 - 32 mmol/L   Glucose, Bld 82 70 - 99 mg/dL   BUN 11 6 - 20 mg/dL   Creatinine, Ser 1.61 0.44 - 1.00 mg/dL   Calcium  9.5 8.9 - 10.3 mg/dL  Total Protein 7.6 6.5 - 8.1 g/dL   Albumin 3.6 3.5 - 5.0 g/dL   AST 18 15 - 41 U/L   ALT 15 0 - 44 U/L   Alkaline Phosphatase 59 38 - 126 U/L   Total Bilirubin 0.5 0.0 - 1.2 mg/dL   GFR, Estimated >78 >29 mL/min   Anion gap 12 5 - 15  Rapid HIV screen (HIV 1/2 Ab+Ag)   Collection Time: 09/03/23  9:34 AM  Result Value Ref Range   HIV-1 P24 Antigen - HIV24 Reactive (A) NON REACTIVE   HIV 1/2 Antibodies NON REACTIVE NON REACTIVE   Interpretation (HIV Ag Ab)      A reactive test result means that HIV 1 p24 antigen has been detected in the specimen. The test result is interpreted as Preliminary Positive for HIV 1 p24 antigen.  Urinalysis, Routine w reflex microscopic -Urine, Clean Catch   Collection Time: 09/03/23  9:34 AM  Result Value Ref Range   Color, Urine YELLOW YELLOW   APPearance CLEAR CLEAR   Specific Gravity, Urine 1.023 1.005 - 1.030   pH 5.0 5.0 - 8.0   Glucose, UA NEGATIVE NEGATIVE mg/dL   Hgb urine dipstick SMALL (A) NEGATIVE   Bilirubin Urine NEGATIVE NEGATIVE   Ketones, ur NEGATIVE NEGATIVE mg/dL   Protein, ur NEGATIVE NEGATIVE mg/dL   Nitrite NEGATIVE NEGATIVE   Leukocytes,Ua NEGATIVE NEGATIVE   RBC / HPF 0-5 0 - 5 RBC/hpf   WBC, UA 0-5 0 - 5 WBC/hpf   Bacteria, UA FEW (A) NONE SEEN    Squamous Epithelial / HPF 6-10 0 - 5 /HPF   Mucus PRESENT   Pregnancy, urine   Collection Time: 09/03/23  9:34 AM  Result Value Ref Range   Preg Test, Ur NEGATIVE NEGATIVE  CBC   Collection Time: 09/03/23 10:38 AM  Result Value Ref Range   WBC 6.9 4.0 - 10.5 K/uL   RBC 4.62 3.87 - 5.11 MIL/uL   Hemoglobin 13.9 12.0 - 15.0 g/dL   HCT 56.2 13.0 - 86.5 %   MCV 91.3 80.0 - 100.0 fL   MCH 30.1 26.0 - 34.0 pg   MCHC 32.9 30.0 - 36.0 g/dL   RDW 78.4 69.6 - 29.5 %   Platelets 205 150 - 400 K/uL   nRBC 0.0 0.0 - 0.2 %    EKG: Orders placed or performed during the hospital encounter of 09/03/23   EKG 12-LEAD   EKG 12-LEAD    Imaging Studies: No results found.    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

## 2023-09-04 NOTE — Progress Notes (Signed)
 Discharge instructions reviewed with patient and placed in envelope

## 2023-09-05 ENCOUNTER — Encounter (HOSPITAL_COMMUNITY): Payer: Self-pay | Admitting: Obstetrics & Gynecology

## 2023-09-06 LAB — SURGICAL PATHOLOGY

## 2023-09-08 NOTE — Anesthesia Postprocedure Evaluation (Signed)
 Anesthesia Post Note  Patient: Sherry [name]  Procedure(s) Performed: ABLATION, CERVIX (Vagina )  Patient location during evaluation: Phase II Anesthesia Type: General Level of consciousness: awake Pain management: pain level controlled Vital Signs Assessment: post-procedure vital signs reviewed and stable Respiratory status: spontaneous breathing and respiratory function stable Cardiovascular status: blood pressure returned to baseline and stable Postop Assessment: no headache and no apparent nausea or vomiting Anesthetic complications: no Comments: Late entry   No notable events documented.   Last Vitals:  Vitals:   09/04/23 1200 09/04/23 1205  BP: 106/73 108/78  Pulse: (!) 103 (!) 104  Resp: (!) 25 (!) 24  Temp:  36.7 C  SpO2: 100% 100%    Last Pain:  Vitals:   09/05/23 1513  TempSrc:   PainSc: 0-No pain                 [name]

## 2023-09-11 ENCOUNTER — Other Ambulatory Visit: Payer: Self-pay | Admitting: Obstetrics & Gynecology

## 2023-09-11 DIAGNOSIS — Z21 Asymptomatic human immunodeficiency virus [HIV] infection status: Secondary | ICD-10-CM

## 2023-09-13 ENCOUNTER — Encounter: Payer: Self-pay | Admitting: Obstetrics & Gynecology

## 2023-09-20 ENCOUNTER — Other Ambulatory Visit: Payer: Self-pay | Admitting: *Deleted

## 2023-09-20 DIAGNOSIS — Z21 Asymptomatic human immunodeficiency virus [HIV] infection status: Secondary | ICD-10-CM

## 2023-09-23 LAB — HIV-1 RNA QUANT-NO REFLEX-BLD
HIV 1 RNA Quant: NOT DETECTED {copies}/mL
HIV-1 RNA Quant, Log: NOT DETECTED {Log_copies}/mL

## 2023-09-24 ENCOUNTER — Ambulatory Visit: Admitting: Obstetrics & Gynecology

## 2023-09-24 ENCOUNTER — Encounter: Payer: Self-pay | Admitting: Obstetrics & Gynecology

## 2023-09-24 VITALS — BP 131/87 | HR 103 | Ht 66.0 in | Wt 206.0 lb

## 2023-09-24 DIAGNOSIS — Z9889 Other specified postprocedural states: Secondary | ICD-10-CM

## 2023-09-24 DIAGNOSIS — Z789 Other specified health status: Secondary | ICD-10-CM

## 2023-09-24 NOTE — Progress Notes (Signed)
  HPI: Patient returns for routine postoperative follow-up having undergone laser conization/ablation of the cervix on 09/04/23.  The patient's immediate postoperative recovery has been unremarkable. Since hospital discharge the patient reports no problems.   Current Outpatient Medications: acetaminophen  (TYLENOL ) 500 MG tablet, Take 500 mg by mouth every 6 (six) hours as needed for moderate pain (pain score 4-6)., Disp: , Rfl:  bisoprolol-hydrochlorothiazide (ZIAC) 5-6.25 MG tablet, Take 1 tablet by mouth daily., Disp: , Rfl:  Drospirenone  (SLYND ) 4 MG TABS, Take 1 tablet (4 mg total) by mouth daily., Disp: 84 tablet, Rfl: 3 HYDROcodone -acetaminophen  (NORCO/VICODIN) 5-325 MG tablet, Take 1 tablet by mouth every 6 (six) hours as needed. (Patient not taking: Reported on 09/24/2023), Disp: 10 tablet, Rfl: 0 ketorolac  (TORADOL ) 10 MG tablet, Take 1 tablet (10 mg total) by mouth every 8 (eight) hours as needed. (Patient not taking: Reported on 09/24/2023), Disp: 15 tablet, Rfl: 0 ondansetron  (ZOFRAN -ODT) 8 MG disintegrating tablet, Take 1 tablet (8 mg total) by mouth every 8 (eight) hours as needed for nausea or vomiting. (Patient not taking: Reported on 09/24/2023), Disp: 8 tablet, Rfl: 0  No current facility-administered medications for this visit.    Blood pressure 131/87, pulse (!) 103, height 5' 6 (1.676 m), weight 206 lb (93.4 kg), last menstrual period 08/03/2023.  Physical Exam: Cervical cone bed is healing well  Diagnostic Tests:   Pathology: +path margin should be negative in situ margin, will follow with surveillance of course  Impression + Management plan:   ICD-10-CM   1. Post-operative state: Laser conization/ablation of cervix 09/04/23 +path margin, which I anticipated intra op, insitu margin should be treated  Z98.890     2. False positive HIV serology: negative RNA quant, Dr Fleeta Rothman aware and agrees no further testing or treatmnt needed  Z78.9           Medications  Prescribed this encounter: No orders of the defined types were placed in this encounter.     Follow up: Return in about 6 months (around 03/26/2024) for first follow up cytology only Pap test, with Dr Jayne.    Vonn VEAR Jayne, MD Attending Physician for the [facility] and [facility] 09/24/2023 11:54 AM

## 2024-02-20 ENCOUNTER — Encounter: Payer: Self-pay | Admitting: Obstetrics & Gynecology

## 2024-02-21 ENCOUNTER — Other Ambulatory Visit: Payer: Self-pay | Admitting: Adult Health

## 2024-02-21 MED ORDER — MEGESTROL ACETATE 40 MG PO TABS: ORAL_TABLET | ORAL | 1 refills | Status: AC

## 2024-02-21 NOTE — Progress Notes (Signed)
Rx megace??  

## 2024-03-26 ENCOUNTER — Other Ambulatory Visit: Payer: Self-pay | Admitting: Adult Health
